# Patient Record
Sex: Female | Born: 1955 | Race: White | Hispanic: No | Marital: Married | Smoking: Former smoker
Health system: Southern US, Community
[De-identification: ages and names within clinical notes are randomized; demographics above are authoritative.]

## PROBLEM LIST (undated history)

## (undated) DIAGNOSIS — R51 Headache: Secondary | ICD-10-CM

## (undated) DIAGNOSIS — E079 Disorder of thyroid, unspecified: Secondary | ICD-10-CM

## (undated) DIAGNOSIS — C50919 Malignant neoplasm of unspecified site of unspecified female breast: Secondary | ICD-10-CM

## (undated) HISTORY — DX: Disorder of thyroid, unspecified: E07.9

## (undated) HISTORY — PX: TUBAL LIGATION: SHX77

## (undated) HISTORY — PX: FRACTURE SURGERY: SHX138

## (undated) HISTORY — PX: OTHER SURGICAL HISTORY: SHX169

## (undated) HISTORY — PX: LASIK: SHX215

## (undated) HISTORY — PX: WISDOM TOOTH EXTRACTION: SHX21

---

## 1999-04-23 ENCOUNTER — Other Ambulatory Visit: Admission: RE | Admit: 1999-04-23 | Discharge: 1999-04-23 | Payer: Self-pay | Admitting: Obstetrics and Gynecology

## 1999-05-07 ENCOUNTER — Encounter: Payer: Self-pay | Admitting: Obstetrics and Gynecology

## 1999-05-07 ENCOUNTER — Encounter: Admission: RE | Admit: 1999-05-07 | Discharge: 1999-05-07 | Payer: Self-pay | Admitting: Obstetrics and Gynecology

## 1999-05-12 ENCOUNTER — Encounter: Admission: RE | Admit: 1999-05-12 | Discharge: 1999-05-12 | Payer: Self-pay | Admitting: Obstetrics and Gynecology

## 1999-05-12 ENCOUNTER — Encounter: Payer: Self-pay | Admitting: Obstetrics and Gynecology

## 1999-12-17 ENCOUNTER — Encounter: Admission: RE | Admit: 1999-12-17 | Discharge: 1999-12-17 | Payer: Self-pay | Admitting: Obstetrics and Gynecology

## 1999-12-17 ENCOUNTER — Encounter: Payer: Self-pay | Admitting: Obstetrics and Gynecology

## 2000-09-25 ENCOUNTER — Encounter: Payer: Self-pay | Admitting: Obstetrics and Gynecology

## 2000-09-25 ENCOUNTER — Encounter: Admission: RE | Admit: 2000-09-25 | Discharge: 2000-09-25 | Payer: Self-pay | Admitting: Obstetrics and Gynecology

## 2000-09-26 ENCOUNTER — Other Ambulatory Visit: Admission: RE | Admit: 2000-09-26 | Discharge: 2000-09-26 | Payer: Self-pay | Admitting: Obstetrics and Gynecology

## 2001-11-08 ENCOUNTER — Encounter: Admission: RE | Admit: 2001-11-08 | Discharge: 2001-11-08 | Payer: Self-pay | Admitting: Obstetrics and Gynecology

## 2001-11-08 ENCOUNTER — Encounter: Payer: Self-pay | Admitting: Obstetrics and Gynecology

## 2001-12-24 ENCOUNTER — Other Ambulatory Visit: Admission: RE | Admit: 2001-12-24 | Discharge: 2001-12-24 | Payer: Self-pay | Admitting: Obstetrics and Gynecology

## 2002-11-25 ENCOUNTER — Encounter: Admission: RE | Admit: 2002-11-25 | Discharge: 2002-11-25 | Payer: Self-pay | Admitting: Obstetrics and Gynecology

## 2002-11-25 ENCOUNTER — Encounter: Payer: Self-pay | Admitting: Obstetrics and Gynecology

## 2003-03-10 ENCOUNTER — Other Ambulatory Visit: Admission: RE | Admit: 2003-03-10 | Discharge: 2003-03-10 | Payer: Self-pay | Admitting: Obstetrics and Gynecology

## 2003-03-11 ENCOUNTER — Encounter: Payer: Self-pay | Admitting: Obstetrics and Gynecology

## 2003-03-11 ENCOUNTER — Encounter: Admission: RE | Admit: 2003-03-11 | Discharge: 2003-03-11 | Payer: Self-pay | Admitting: Obstetrics and Gynecology

## 2003-04-23 ENCOUNTER — Emergency Department (HOSPITAL_COMMUNITY): Admission: AC | Admit: 2003-04-23 | Discharge: 2003-04-23 | Payer: Self-pay

## 2003-04-30 ENCOUNTER — Emergency Department (HOSPITAL_COMMUNITY): Admission: AD | Admit: 2003-04-30 | Discharge: 2003-04-30 | Payer: Self-pay | Admitting: Family Medicine

## 2003-06-30 ENCOUNTER — Encounter: Admission: RE | Admit: 2003-06-30 | Discharge: 2003-06-30 | Payer: Self-pay | Admitting: Obstetrics and Gynecology

## 2004-07-05 ENCOUNTER — Other Ambulatory Visit: Admission: RE | Admit: 2004-07-05 | Discharge: 2004-07-05 | Payer: Self-pay | Admitting: Obstetrics and Gynecology

## 2004-07-08 ENCOUNTER — Encounter: Admission: RE | Admit: 2004-07-08 | Discharge: 2004-07-08 | Payer: Self-pay | Admitting: Obstetrics and Gynecology

## 2005-06-21 ENCOUNTER — Encounter: Admission: RE | Admit: 2005-06-21 | Discharge: 2005-06-21 | Payer: Self-pay | Admitting: Obstetrics and Gynecology

## 2005-07-13 ENCOUNTER — Other Ambulatory Visit: Admission: RE | Admit: 2005-07-13 | Discharge: 2005-07-13 | Payer: Self-pay | Admitting: Obstetrics and Gynecology

## 2006-07-18 ENCOUNTER — Encounter: Admission: RE | Admit: 2006-07-18 | Discharge: 2006-07-18 | Payer: Self-pay | Admitting: Obstetrics and Gynecology

## 2007-11-20 ENCOUNTER — Encounter: Admission: RE | Admit: 2007-11-20 | Discharge: 2007-11-20 | Payer: Self-pay | Admitting: Obstetrics and Gynecology

## 2008-11-21 ENCOUNTER — Encounter: Admission: RE | Admit: 2008-11-21 | Discharge: 2008-11-21 | Payer: Self-pay | Admitting: Obstetrics and Gynecology

## 2010-01-25 ENCOUNTER — Encounter: Admission: RE | Admit: 2010-01-25 | Discharge: 2010-01-25 | Payer: Self-pay | Admitting: Obstetrics and Gynecology

## 2010-07-05 ENCOUNTER — Encounter: Payer: Self-pay | Admitting: Obstetrics and Gynecology

## 2010-12-27 ENCOUNTER — Other Ambulatory Visit: Payer: Self-pay | Admitting: Obstetrics and Gynecology

## 2010-12-27 DIAGNOSIS — Z1231 Encounter for screening mammogram for malignant neoplasm of breast: Secondary | ICD-10-CM

## 2011-01-31 ENCOUNTER — Ambulatory Visit: Payer: Self-pay

## 2011-02-04 ENCOUNTER — Ambulatory Visit
Admission: RE | Admit: 2011-02-04 | Discharge: 2011-02-04 | Disposition: A | Discharge status: Home / Self Care | Payer: BC Managed Care – PPO | Source: Ambulatory Visit | Attending: Obstetrics and Gynecology | Admitting: Obstetrics and Gynecology

## 2011-02-04 DIAGNOSIS — Z1231 Encounter for screening mammogram for malignant neoplasm of breast: Secondary | ICD-10-CM

## 2011-02-08 ENCOUNTER — Other Ambulatory Visit: Payer: Self-pay | Admitting: Obstetrics and Gynecology

## 2011-02-08 DIAGNOSIS — R928 Other abnormal and inconclusive findings on diagnostic imaging of breast: Secondary | ICD-10-CM

## 2011-02-25 ENCOUNTER — Ambulatory Visit
Admission: RE | Admit: 2011-02-25 | Discharge: 2011-02-25 | Disposition: A | Discharge status: Home / Self Care | Payer: BC Managed Care – PPO | Source: Ambulatory Visit | Attending: Obstetrics and Gynecology | Admitting: Obstetrics and Gynecology

## 2011-02-25 DIAGNOSIS — R928 Other abnormal and inconclusive findings on diagnostic imaging of breast: Secondary | ICD-10-CM

## 2011-03-30 ENCOUNTER — Inpatient Hospital Stay (INDEPENDENT_AMBULATORY_CARE_PROVIDER_SITE_OTHER)
Admission: RE | Admit: 2011-03-30 | Discharge: 2011-03-30 | Disposition: A | Discharge status: Home / Self Care | Payer: BC Managed Care – PPO | Source: Ambulatory Visit | Attending: Family Medicine | Admitting: Family Medicine

## 2011-03-30 DIAGNOSIS — R51 Headache: Secondary | ICD-10-CM

## 2011-03-30 LAB — POCT I-STAT, CHEM 8
HCT: 42 % (ref 36.0–46.0)
Hemoglobin: 14.3 g/dL (ref 12.0–15.0)
Potassium: 3.8 mEq/L (ref 3.5–5.1)
Sodium: 141 mEq/L (ref 135–145)

## 2011-03-30 LAB — CBC
HCT: 40 % (ref 36.0–46.0)
Hemoglobin: 13.5 g/dL (ref 12.0–15.0)
MCH: 31.2 pg (ref 26.0–34.0)
MCHC: 33.8 g/dL (ref 30.0–36.0)
MCV: 92.4 fL (ref 78.0–100.0)
Platelets: 286 10*3/uL (ref 150–400)
RBC: 4.33 MIL/uL (ref 3.87–5.11)
RDW: 13.1 % (ref 11.5–15.5)
WBC: 6.4 10*3/uL (ref 4.0–10.5)

## 2011-03-30 LAB — DIFFERENTIAL
Basophils Absolute: 0 10*3/uL (ref 0.0–0.1)
Basophils Relative: 0 % (ref 0–1)
Eosinophils Absolute: 0.1 10*3/uL (ref 0.0–0.7)
Eosinophils Relative: 2 % (ref 0–5)
Lymphocytes Relative: 56 % — ABNORMAL HIGH (ref 12–46)
Lymphs Abs: 3.6 10*3/uL (ref 0.7–4.0)
Monocytes Absolute: 0.5 10*3/uL (ref 0.1–1.0)
Monocytes Relative: 8 % (ref 3–12)
Neutro Abs: 2.2 10*3/uL (ref 1.7–7.7)
Neutrophils Relative %: 34 % — ABNORMAL LOW (ref 43–77)

## 2011-03-30 LAB — POCT URINALYSIS DIP (DEVICE)
Bilirubin Urine: NEGATIVE
Glucose, UA: NEGATIVE mg/dL
Hgb urine dipstick: NEGATIVE
Leukocytes, UA: NEGATIVE
Nitrite: NEGATIVE
Protein, ur: NEGATIVE mg/dL
Specific Gravity, Urine: >=1.03 (ref 1.005–1.030)
Urobilinogen, UA: 1 mg/dL (ref 0.0–1.0)
pH: 5 (ref 5.0–8.0)

## 2011-07-19 ENCOUNTER — Other Ambulatory Visit: Payer: Self-pay | Admitting: Obstetrics and Gynecology

## 2011-07-19 DIAGNOSIS — N63 Unspecified lump in unspecified breast: Secondary | ICD-10-CM

## 2011-08-18 ENCOUNTER — Ambulatory Visit
Admission: RE | Admit: 2011-08-18 | Discharge: 2011-08-18 | Disposition: A | Discharge status: Home / Self Care | Payer: BC Managed Care – PPO | Source: Ambulatory Visit | Attending: Obstetrics and Gynecology | Admitting: Obstetrics and Gynecology

## 2011-08-18 DIAGNOSIS — N63 Unspecified lump in unspecified breast: Secondary | ICD-10-CM

## 2012-08-22 ENCOUNTER — Other Ambulatory Visit: Payer: Self-pay | Admitting: Obstetrics and Gynecology

## 2012-09-03 ENCOUNTER — Ambulatory Visit
Admission: RE | Admit: 2012-09-03 | Discharge: 2012-09-03 | Disposition: A | Discharge status: Home / Self Care | Payer: BC Managed Care – PPO | Source: Ambulatory Visit | Attending: Obstetrics and Gynecology | Admitting: Obstetrics and Gynecology

## 2012-09-03 DIAGNOSIS — R921 Mammographic calcification found on diagnostic imaging of breast: Secondary | ICD-10-CM

## 2012-09-03 DIAGNOSIS — Z780 Asymptomatic menopausal state: Secondary | ICD-10-CM

## 2013-03-13 ENCOUNTER — Other Ambulatory Visit: Payer: Self-pay | Admitting: Obstetrics and Gynecology

## 2013-03-13 DIAGNOSIS — R921 Mammographic calcification found on diagnostic imaging of breast: Secondary | ICD-10-CM

## 2013-04-02 ENCOUNTER — Ambulatory Visit
Admission: RE | Admit: 2013-04-02 | Discharge: 2013-04-02 | Disposition: A | Discharge status: Home / Self Care | Payer: BC Managed Care – PPO | Source: Ambulatory Visit | Attending: Obstetrics and Gynecology | Admitting: Obstetrics and Gynecology

## 2013-04-02 ENCOUNTER — Other Ambulatory Visit: Payer: Self-pay | Admitting: Obstetrics and Gynecology

## 2013-04-02 DIAGNOSIS — R921 Mammographic calcification found on diagnostic imaging of breast: Secondary | ICD-10-CM

## 2013-04-05 ENCOUNTER — Ambulatory Visit
Admission: RE | Admit: 2013-04-05 | Discharge: 2013-04-05 | Disposition: A | Discharge status: Home / Self Care | Payer: BC Managed Care – PPO | Source: Ambulatory Visit | Attending: Obstetrics and Gynecology | Admitting: Obstetrics and Gynecology

## 2013-04-05 DIAGNOSIS — R921 Mammographic calcification found on diagnostic imaging of breast: Secondary | ICD-10-CM

## 2013-04-11 ENCOUNTER — Ambulatory Visit (INDEPENDENT_AMBULATORY_CARE_PROVIDER_SITE_OTHER): Payer: BC Managed Care – PPO | Admitting: General Surgery

## 2013-04-11 ENCOUNTER — Encounter (INDEPENDENT_AMBULATORY_CARE_PROVIDER_SITE_OTHER): Payer: Self-pay | Admitting: General Surgery

## 2013-04-11 ENCOUNTER — Encounter (INDEPENDENT_AMBULATORY_CARE_PROVIDER_SITE_OTHER): Payer: Self-pay

## 2013-04-11 VITALS — BP 128/66 | HR 92 | Resp 16 | Ht 63.5 in | Wt 162.6 lb

## 2013-04-11 DIAGNOSIS — D0512 Intraductal carcinoma in situ of left breast: Secondary | ICD-10-CM

## 2013-04-11 DIAGNOSIS — D0590 Unspecified type of carcinoma in situ of unspecified breast: Secondary | ICD-10-CM

## 2013-04-11 DIAGNOSIS — D0510 Intraductal carcinoma in situ of unspecified breast: Secondary | ICD-10-CM | POA: Insufficient documentation

## 2013-04-11 NOTE — Progress Notes (Signed)
Patient ID: Savannah Lang, female   DOB: 1955/12/28, 57 y.o.   MRN: [id number]  Chief Complaint  Patient presents with  . New Evaluation    eval ADH ca    HPI Savannah Lang is a 57 y.o. female.  We are asked to see the patient in consultation by Dr. 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 The patient is a 57 year old white female who recently went for a 6 month followup for some calcification seen in the left breast. At that time a new area of suspicious calcifications were seen in the upper outer left breast. 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 She denies any breast pain. She denies any discharge or nipple. She has no first degree relatives with breast cancer although she does have a couple of aunts on her mother side that had breast cancer. She has otherwise been in good health. HPI  Past Medical History  Diagnosis Date  . Thyroid disease     Past Surgical History  Procedure Laterality Date  . Tubal ligation    . Lasik    . Wisdom tooth extraction      Family History  Problem Relation Age of Onset  . Cancer Father     father  . Cancer Maternal Aunt     breast  . Cancer Maternal Grandfather     pancreas  . Cancer Paternal Grandfather     kidney    Social History History  Substance Use Topics  . Smoking status: Former Games developer  . Smokeless tobacco: Never Used  . Alcohol Use: No    Allergies  Allergen Reactions  . Codeine     Unknown to pt  . Sulfa Antibiotics     Unknown to pt    Current Outpatient Prescriptions  Medication Sig Dispense Refill  . Cholecalciferol (VITAMIN D-3 PO) Take by mouth.      . SYNTHROID 50 MCG tablet        No current facility-administered medications for this visit.    Review of Systems Review of Systems  Constitutional: Negative.   HENT: Negative.   Eyes: Negative.   Respiratory: Negative.   Cardiovascular: Negative.   Gastrointestinal: Negative.   Endocrine: Negative.   Genitourinary: Negative.    Musculoskeletal: Negative.   Skin: Negative.   Allergic/Immunologic: Negative.   Neurological: Negative.   Hematological: Negative.   Psychiatric/Behavioral: Negative.     Blood pressure 128/66, pulse 92, resp. rate 16, height 5' 3.5" (1.613 m), weight 162 lb 9.6 oz (73.755 kg).  Physical Exam Physical Exam  Constitutional: She is oriented to person, place, and time. She appears well-developed and well-nourished.  HENT:  Head: Normocephalic and atraumatic.  Eyes: Conjunctivae and EOM are normal. Pupils are equal, round, and reactive to light.  Neck: Normal range of motion. Neck supple.  Cardiovascular: Normal rate, regular rhythm and normal heart sounds.   Pulmonary/Chest: Effort normal and breath sounds normal.  There is no palpable mass in either breast. There is no palpable axillary, supraclavicular, or cervical lymphadenopathy  Abdominal: Bowel sounds are normal. She exhibits no mass. There is no tenderness.  Musculoskeletal: Normal range of motion.  Neurological: She is alert and oriented to person, place, and time.  Skin: Skin is warm and dry.  Psychiatric: She has a normal mood and affect. Her behavior is normal.    Data Reviewed As above  Assessment    The patient appears to have a small area of DCIS  in the upper outer left breast. I have talked to her in detail today about the different options for treatment of her cancer. This includes breast conservation versus mastectomy. And she feels strongly that she would like to preserve her breast and I think this is a reasonable option. She will require a wire localization. Because of the location of the DCIS in the probability that we would be unable to come back and mapper lymph nodes should there be an area of invasion I think she should have a sentinel node mapping done at the time of the surgery. I have discussed the surgery with her in detail including the risks and benefits as well as some of the technical aspects and she  understands and wishes to proceed. Once we have her final pathology back we will refer her to a medical and radiation oncology    Plan    Plan for left breast wire localized lumpectomy and sentinel node mapping        TOTH III,Nichollas Perusse S 04/11/2013, 4:27 PM

## 2013-04-11 NOTE — Patient Instructions (Signed)
Plan for left breast wire localized lumpectomy and sentinel node mapping 

## 2013-04-15 ENCOUNTER — Telehealth (INDEPENDENT_AMBULATORY_CARE_PROVIDER_SITE_OTHER): Payer: Self-pay

## 2013-04-15 NOTE — Telephone Encounter (Signed)
Patient is having sx 05-03-13 she has nasal congestion and feels this may prevent her having surgery. I advised her she would be elevated on  04-24-13 pre-op visit , they would determine if she is ok to go forward with surgery. She is also asking how many Lump nodes will be removed. I told her normally 2 to 3 but I would forward this to Dr. Carolynne Edouard for advice. Patient verbalized understanding

## 2013-04-16 NOTE — Telephone Encounter (Signed)
Called patient back and explained that we will not know how many lymph nodes will be taken out. Explained that a dye will be injected into her breast at time of surgery and how ever many nodes the dye goes to, that will be the number of nodes that he will take out. Explained the number is different with every patient. Patient states she understands this process now. She will call back with any more questions that arise.

## 2013-04-24 ENCOUNTER — Encounter (HOSPITAL_COMMUNITY): Payer: Self-pay

## 2013-04-24 ENCOUNTER — Encounter (HOSPITAL_COMMUNITY)
Admission: RE | Admit: 2013-04-24 | Discharge: 2013-04-24 | Disposition: A | Discharge status: Home / Self Care | Payer: BC Managed Care – PPO | Source: Ambulatory Visit | Attending: General Surgery | Admitting: General Surgery

## 2013-04-24 ENCOUNTER — Ambulatory Visit (HOSPITAL_COMMUNITY)
Admission: RE | Admit: 2013-04-24 | Discharge: 2013-04-24 | Disposition: A | Discharge status: Home / Self Care | Payer: BC Managed Care – PPO | Source: Ambulatory Visit | Attending: General Surgery | Admitting: General Surgery

## 2013-04-24 DIAGNOSIS — R0989 Other specified symptoms and signs involving the circulatory and respiratory systems: Secondary | ICD-10-CM | POA: Insufficient documentation

## 2013-04-24 DIAGNOSIS — R05 Cough: Secondary | ICD-10-CM | POA: Insufficient documentation

## 2013-04-24 DIAGNOSIS — Z01818 Encounter for other preprocedural examination: Secondary | ICD-10-CM | POA: Insufficient documentation

## 2013-04-24 DIAGNOSIS — R059 Cough, unspecified: Secondary | ICD-10-CM | POA: Insufficient documentation

## 2013-04-24 DIAGNOSIS — Z87891 Personal history of nicotine dependence: Secondary | ICD-10-CM | POA: Insufficient documentation

## 2013-04-24 HISTORY — DX: Headache: R51

## 2013-04-24 LAB — COMPREHENSIVE METABOLIC PANEL
AST: 24 U/L (ref 0–37)
Albumin: 3.9 g/dL (ref 3.5–5.2)
Alkaline Phosphatase: 83 U/L (ref 39–117)
BUN: 11 mg/dL (ref 6–23)
Chloride: 101 mEq/L (ref 96–112)
Glucose, Bld: 68 mg/dL — ABNORMAL LOW (ref 70–99)
Potassium: 3.8 mEq/L (ref 3.5–5.1)
Total Bilirubin: 0.2 mg/dL — ABNORMAL LOW (ref 0.3–1.2)

## 2013-04-24 LAB — CBC WITH DIFFERENTIAL/PLATELET
Hemoglobin: 12.8 g/dL (ref 12.0–15.0)
Lymphs Abs: 4.2 10*3/uL — ABNORMAL HIGH (ref 0.7–4.0)
Monocytes Relative: 8 % (ref 3–12)
Neutro Abs: 2.7 10*3/uL (ref 1.7–7.7)
Neutrophils Relative %: 36 % — ABNORMAL LOW (ref 43–77)
Platelets: 289 10*3/uL (ref 150–400)
RBC: 4.09 MIL/uL (ref 3.87–5.11)
WBC: 7.6 10*3/uL (ref 4.0–10.5)

## 2013-04-24 NOTE — Pre-Procedure Instructions (Signed)
Savannah Lang  04/24/2013   Your procedure is scheduled on: Friday November 21,2014 at 1130 AM  Report to [facility] Main Entrance "A" at 0930 AM.  Call this number if you have problems the morning of surgery: [phone]   Remember:   Do not eat food or drink liquids after midnight.   Take these medicines the morning of surgery with A SIP OF WATER: Synthroid   Stop Aspirin, Vitamins, Nsaids and Herbal Medications 5 days prior to surgery   Do not wear jewelry, make-up or nail polish.  Do not wear lotions, powders, or perfumes. You may wear deodorant.  Do not shave 48 hours prior to surgery.  Do not bring valuables to the hospital.  [facility] is not responsible for any belongings or valuables.               Contacts, dentures or bridgework may not be worn into surgery.  Leave suitcase in the car. After surgery it may be brought to your room.  For patients admitted to the hospital, discharge time is determined by your  treatment team.               Patients discharged the day of surgery will not be allowed to drive home.    Special Instructions: Shower using CHG 2 nights before surgery and the night before surgery.  If you shower the day of surgery use CHG.  Use special wash - you have one bottle of CHG for all showers.  You should use approximately 1/3 of the bottle for each shower.   Please read over the following fact sheets that you were given: Pain Booklet, Coughing and Deep Breathing, Surgical Site Infection Prevention and Anesthesia Post-op Instructions

## 2013-04-26 ENCOUNTER — Ambulatory Visit (INDEPENDENT_AMBULATORY_CARE_PROVIDER_SITE_OTHER): Payer: BC Managed Care – PPO | Admitting: General Surgery

## 2013-05-02 MED ORDER — CEFAZOLIN SODIUM-DEXTROSE 2-3 GM-% IV SOLR
2.0000 g | INTRAVENOUS | Status: AC
  Administered 2013-05-03: 2 g via INTRAVENOUS
  Filled 2013-05-02: qty 50

## 2013-05-03 ENCOUNTER — Encounter (HOSPITAL_COMMUNITY): Payer: Self-pay | Admitting: *Deleted

## 2013-05-03 ENCOUNTER — Encounter (HOSPITAL_COMMUNITY): Payer: BC Managed Care – PPO | Admitting: Certified Registered"

## 2013-05-03 ENCOUNTER — Encounter (HOSPITAL_COMMUNITY)
Admission: RE | Admit: 2013-05-03 | Discharge: 2013-05-03 | Disposition: A | Discharge status: Home / Self Care | Payer: BC Managed Care – PPO | Source: Ambulatory Visit | Attending: General Surgery | Admitting: General Surgery

## 2013-05-03 ENCOUNTER — Ambulatory Visit (HOSPITAL_COMMUNITY): Payer: BC Managed Care – PPO | Admitting: Certified Registered"

## 2013-05-03 ENCOUNTER — Ambulatory Visit
Admission: RE | Admit: 2013-05-03 | Discharge: 2013-05-03 | Disposition: A | Discharge status: Home / Self Care | Payer: BC Managed Care – PPO | Source: Ambulatory Visit | Attending: General Surgery | Admitting: General Surgery

## 2013-05-03 ENCOUNTER — Ambulatory Visit (HOSPITAL_COMMUNITY)
Admission: RE | Admit: 2013-05-03 | Discharge: 2013-05-03 | Disposition: A | Discharge status: Home / Self Care | Payer: BC Managed Care – PPO | Source: Ambulatory Visit | Attending: General Surgery | Admitting: General Surgery

## 2013-05-03 ENCOUNTER — Encounter (HOSPITAL_COMMUNITY)
Admission: RE | Disposition: A | Discharge status: Home / Self Care | Payer: Self-pay | Source: Ambulatory Visit | Attending: General Surgery

## 2013-05-03 DIAGNOSIS — D0590 Unspecified type of carcinoma in situ of unspecified breast: Secondary | ICD-10-CM | POA: Insufficient documentation

## 2013-05-03 DIAGNOSIS — N6019 Diffuse cystic mastopathy of unspecified breast: Secondary | ICD-10-CM

## 2013-05-03 DIAGNOSIS — D0512 Intraductal carcinoma in situ of left breast: Secondary | ICD-10-CM

## 2013-05-03 DIAGNOSIS — Z87891 Personal history of nicotine dependence: Secondary | ICD-10-CM | POA: Insufficient documentation

## 2013-05-03 DIAGNOSIS — R920 Mammographic microcalcification found on diagnostic imaging of breast: Secondary | ICD-10-CM

## 2013-05-03 DIAGNOSIS — D4860 Neoplasm of uncertain behavior of unspecified breast: Secondary | ICD-10-CM

## 2013-05-03 HISTORY — PX: BREAST LUMPECTOMY WITH NEEDLE LOCALIZATION AND AXILLARY SENTINEL LYMPH NODE BX: SHX5760

## 2013-05-03 SURGERY — BREAST LUMPECTOMY WITH NEEDLE LOCALIZATION AND AXILLARY SENTINEL LYMPH NODE BX
Anesthesia: General | Laterality: Left | Wound class: Clean

## 2013-05-03 MED ORDER — PROPOFOL 10 MG/ML IV BOLUS
INTRAVENOUS | Status: DC | PRN
  Administered 2013-05-03: 160 mg via INTRAVENOUS

## 2013-05-03 MED ORDER — LIDOCAINE HCL (CARDIAC) 20 MG/ML IV SOLN
INTRAVENOUS | Status: DC | PRN
  Administered 2013-05-03: 50 mg via INTRAVENOUS

## 2013-05-03 MED ORDER — SODIUM CHLORIDE 0.9 % IJ SOLN
INTRAMUSCULAR | Status: DC | PRN
  Administered 2013-05-03: 12:00:00 via SUBCUTANEOUS

## 2013-05-03 MED ORDER — MIDAZOLAM HCL 2 MG/2ML IJ SOLN
1.0000 mg | INTRAMUSCULAR | Status: DC | PRN
  Administered 2013-05-03: 1 mg via INTRAVENOUS

## 2013-05-03 MED ORDER — CHLORHEXIDINE GLUCONATE 4 % EX LIQD: 1.0000 "application " | Freq: Once | CUTANEOUS | Status: DC

## 2013-05-03 MED ORDER — LACTATED RINGERS IV SOLN
INTRAVENOUS | Status: DC
  Administered 2013-05-03: 11:00:00 via INTRAVENOUS

## 2013-05-03 MED ORDER — LACTATED RINGERS IV SOLN
INTRAVENOUS | Status: DC | PRN
  Administered 2013-05-03: 11:00:00 via INTRAVENOUS

## 2013-05-03 MED ORDER — TECHNETIUM TC 99M SULFUR COLLOID FILTERED
1.0000 | Freq: Once | INTRAVENOUS | Status: AC | PRN
  Administered 2013-05-03: 1 via INTRADERMAL

## 2013-05-03 MED ORDER — FENTANYL CITRATE 0.05 MG/ML IJ SOLN
50.0000 ug | Freq: Once | INTRAMUSCULAR | Status: AC
  Administered 2013-05-03: 50 ug via INTRAVENOUS

## 2013-05-03 MED ORDER — ONDANSETRON HCL 4 MG/2ML IJ SOLN
INTRAMUSCULAR | Status: DC | PRN
  Administered 2013-05-03: 4 mg via INTRAVENOUS

## 2013-05-03 MED ORDER — EPHEDRINE SULFATE 50 MG/ML IJ SOLN
INTRAMUSCULAR | Status: DC | PRN
  Administered 2013-05-03: 15 mg via INTRAVENOUS

## 2013-05-03 MED ORDER — FENTANYL CITRATE 0.05 MG/ML IJ SOLN
INTRAMUSCULAR | Status: DC | PRN
  Administered 2013-05-03: 100 ug via INTRAVENOUS
  Administered 2013-05-03 (×2): 50 ug via INTRAVENOUS

## 2013-05-03 MED ORDER — PHENYLEPHRINE HCL 10 MG/ML IJ SOLN
INTRAMUSCULAR | Status: DC | PRN
  Administered 2013-05-03: 120 ug via INTRAVENOUS
  Administered 2013-05-03 (×2): 80 ug via INTRAVENOUS
  Administered 2013-05-03: 120 ug via INTRAVENOUS

## 2013-05-03 MED ORDER — BUPIVACAINE-EPINEPHRINE 0.25% -1:200000 IJ SOLN
INTRAMUSCULAR | Status: DC | PRN
  Administered 2013-05-03: 25 mL

## 2013-05-03 MED ORDER — FENTANYL CITRATE 0.05 MG/ML IJ SOLN
25.0000 ug | INTRAMUSCULAR | Status: DC | PRN
  Administered 2013-05-03 (×3): 25 ug via INTRAVENOUS

## 2013-05-03 MED ORDER — OXYCODONE-ACETAMINOPHEN 5-325 MG PO TABS: 1.0000 | ORAL_TABLET | ORAL | Status: DC | PRN

## 2013-05-03 MED ORDER — FENTANYL CITRATE 0.05 MG/ML IJ SOLN
INTRAMUSCULAR | Status: AC
  Administered 2013-05-03: 25 ug via INTRAVENOUS
  Filled 2013-05-03: qty 2

## 2013-05-03 MED ORDER — MIDAZOLAM HCL 5 MG/5ML IJ SOLN
INTRAMUSCULAR | Status: DC | PRN
  Administered 2013-05-03: 2 mg via INTRAVENOUS

## 2013-05-03 MED ORDER — 0.9 % SODIUM CHLORIDE (POUR BTL) OPTIME
TOPICAL | Status: DC | PRN
  Administered 2013-05-03: 1000 mL

## 2013-05-03 SURGICAL SUPPLY — 52 items
ADH SKN CLS APL DERMABOND .7 (GAUZE/BANDAGES/DRESSINGS) ×1
APPLIER CLIP 9.375 MED OPEN (MISCELLANEOUS) ×2
APR CLP MED 9.3 20 MLT OPN (MISCELLANEOUS) ×1
BINDER BREAST LRG (GAUZE/BANDAGES/DRESSINGS) IMPLANT
BINDER BREAST XLRG (GAUZE/BANDAGES/DRESSINGS) IMPLANT
BLADE SURG 10 STRL SS (BLADE) ×2 IMPLANT
BLADE SURG 15 STRL LF DISP TIS (BLADE) ×1 IMPLANT
BLADE SURG 15 STRL SS (BLADE) ×2
CANISTER SUCTION 2500CC (MISCELLANEOUS) ×2 IMPLANT
CHLORAPREP W/TINT 26ML (MISCELLANEOUS) ×2 IMPLANT
CLIP APPLIE 9.375 MED OPEN (MISCELLANEOUS) IMPLANT
CONT SPEC 4OZ CLIKSEAL STRL BL (MISCELLANEOUS) ×2 IMPLANT
COVER PROBE W GEL 5X96 (DRAPES) ×2 IMPLANT
COVER SURGICAL LIGHT HANDLE (MISCELLANEOUS) ×2 IMPLANT
DERMABOND ADVANCED (GAUZE/BANDAGES/DRESSINGS) ×1
DERMABOND ADVANCED .7 DNX12 (GAUZE/BANDAGES/DRESSINGS) ×1 IMPLANT
DEVICE DUBIN SPECIMEN MAMMOGRA (MISCELLANEOUS) ×2 IMPLANT
DRAPE CHEST BREAST 15X10 FENES (DRAPES) ×2 IMPLANT
DRAPE UTILITY 15X26 W/TAPE STR (DRAPE) ×4 IMPLANT
ELECT COATED BLADE 2.86 ST (ELECTRODE) ×2 IMPLANT
ELECT REM PT RETURN 9FT ADLT (ELECTROSURGICAL) ×2
ELECTRODE REM PT RTRN 9FT ADLT (ELECTROSURGICAL) ×1 IMPLANT
GLOVE BIO SURGEON STRL SZ7.5 (GLOVE) ×3 IMPLANT
GLOVE BIOGEL PI IND STRL 7.0 (GLOVE) IMPLANT
GLOVE BIOGEL PI INDICATOR 7.0 (GLOVE) ×4
GLOVE SS BIOGEL STRL SZ 6.5 (GLOVE) IMPLANT
GLOVE SUPERSENSE BIOGEL SZ 6.5 (GLOVE) ×2
GLOVE SURG SS PI 7.0 STRL IVOR (GLOVE) ×1 IMPLANT
GOWN STRL NON-REIN LRG LVL3 (GOWN DISPOSABLE) ×6 IMPLANT
KIT BASIN OR (CUSTOM PROCEDURE TRAY) ×2 IMPLANT
KIT MARKER MARGIN INK (KITS) ×1 IMPLANT
KIT ROOM TURNOVER OR (KITS) ×2 IMPLANT
NDL 18GX1X1/2 (RX/OR ONLY) (NEEDLE) ×1 IMPLANT
NDL HYPO 25GX1X1/2 BEV (NEEDLE) ×2 IMPLANT
NEEDLE 18GX1X1/2 (RX/OR ONLY) (NEEDLE) ×2 IMPLANT
NEEDLE HYPO 25GX1X1/2 BEV (NEEDLE) ×4 IMPLANT
NS IRRIG 1000ML POUR BTL (IV SOLUTION) ×2 IMPLANT
PACK SURGICAL SETUP 50X90 (CUSTOM PROCEDURE TRAY) ×2 IMPLANT
PAD ARMBOARD 7.5X6 YLW CONV (MISCELLANEOUS) ×2 IMPLANT
PENCIL BUTTON HOLSTER BLD 10FT (ELECTRODE) ×2 IMPLANT
SPONGE LAP 18X18 X RAY DECT (DISPOSABLE) ×2 IMPLANT
SUT MNCRL AB 4-0 PS2 18 (SUTURE) ×3 IMPLANT
SUT SILK 2 0 SH (SUTURE) IMPLANT
SUT VIC AB 3-0 54X BRD REEL (SUTURE) ×1 IMPLANT
SUT VIC AB 3-0 BRD 54 (SUTURE) ×2
SUT VIC AB 3-0 SH 18 (SUTURE) ×2 IMPLANT
SYR BULB 3OZ (MISCELLANEOUS) ×2 IMPLANT
SYR CONTROL 10ML LL (SYRINGE) ×4 IMPLANT
TOWEL OR 17X24 6PK STRL BLUE (TOWEL DISPOSABLE) ×2 IMPLANT
TOWEL OR 17X26 10 PK STRL BLUE (TOWEL DISPOSABLE) ×2 IMPLANT
TUBE CONNECTING 12X1/4 (SUCTIONS) ×2 IMPLANT
YANKAUER SUCT BULB TIP NO VENT (SUCTIONS) ×2 IMPLANT

## 2013-05-03 NOTE — Preoperative (Signed)
Beta Blockers   Reason not to administer Beta Blockers:Not Applicable 

## 2013-05-03 NOTE — Transfer of Care (Signed)
Immediate Anesthesia Transfer of Care Note  Patient: Savannah Lang  Procedure(s) Performed: Procedure(s): BREAST LUMPECTOMY WITH NEEDLE LOCALIZATION AND AXILLARY SENTINEL LYMPH NODE BX (Left)  Patient Location: PACU  Anesthesia Type:General  Level of Consciousness: awake and oriented  Airway & Oxygen Therapy: Patient Spontanous Breathing and Patient connected to nasal cannula oxygen  Post-op Assessment: Report given to PACU RN  Post vital signs: Reviewed and stable  Complications: No apparent anesthesia complications

## 2013-05-03 NOTE — Anesthesia Postprocedure Evaluation (Signed)
  Anesthesia Post-op Note  Patient: Savannah Lang  Procedure(s) Performed: Procedure(s): BREAST LUMPECTOMY WITH NEEDLE LOCALIZATION AND AXILLARY SENTINEL LYMPH NODE BX (Left)  Patient Location: PACU  Anesthesia Type:General  Level of Consciousness: awake  Airway and Oxygen Therapy: Patient Spontanous Breathing  Post-op Pain: mild  Post-op Assessment: Post-op Vital signs reviewed  Post-op Vital Signs: Reviewed  Complications: No apparent anesthesia complications

## 2013-05-03 NOTE — Anesthesia Preprocedure Evaluation (Addendum)
Anesthesia Evaluation  Patient identified by MRN, date of birth, ID band Patient awake    Reviewed: Allergy & Precautions, H&P , NPO status , Patient's Chart, lab work & pertinent test results  Airway Mallampati: II TM Distance: >3 FB Neck ROM: Full    Dental  (+) Teeth Intact and Dental Advisory Given   Pulmonary former smoker,  breath sounds clear to auscultation        Cardiovascular negative cardio ROS  Rhythm:Regular Rate:Normal     Neuro/Psych    GI/Hepatic negative GI ROS, Neg liver ROS,   Endo/Other  negative endocrine ROS  Renal/GU negative Renal ROS     Musculoskeletal   Abdominal   Peds  Hematology   Anesthesia Other Findings   Reproductive/Obstetrics                          Anesthesia Physical Anesthesia Plan  ASA: III  Anesthesia Plan: General   Post-op Pain Management:    Induction: Intravenous  Airway Management Planned:   Additional Equipment:   Intra-op Plan:   Post-operative Plan: Extubation in OR  Informed Consent: I have reviewed the patients History and Physical, chart, labs and discussed the procedure including the risks, benefits and alternatives for the proposed anesthesia with the patient or authorized representative who has indicated his/her understanding and acceptance.   Dental advisory given  Plan Discussed with: CRNA, Anesthesiologist and Surgeon  Anesthesia Plan Comments:         Anesthesia Quick Evaluation

## 2013-05-03 NOTE — Interval H&P Note (Signed)
History and Physical Interval Note:  05/03/2013 10:47 AM  Savannah Lang  has presented today for surgery, with the diagnosis of left breast DCIS  The various methods of treatment have been discussed with the patient and family. After consideration of risks, benefits and other options for treatment, the patient has consented to  Procedure(s): BREAST LUMPECTOMY WITH NEEDLE LOCALIZATION AND AXILLARY SENTINEL LYMPH NODE BX (Left) as a surgical intervention .  The patient's history has been reviewed, patient examined, no change in status, stable for surgery.  I have reviewed the patient's chart and labs.  Questions were answered to the patient's satisfaction.     TOTH III,PAUL S

## 2013-05-03 NOTE — Anesthesia Procedure Notes (Signed)
Procedure Name: LMA Insertion Date/Time: 05/03/2013 11:32 AM Performed by: Jefm Miles E Pre-anesthesia Checklist: Patient identified, Emergency Drugs available, Suction available, Patient being monitored and Timeout performed Patient Re-evaluated:Patient Re-evaluated prior to inductionOxygen Delivery Method: Circle system utilized Preoxygenation: Pre-oxygenation with 100% oxygen Intubation Type: IV induction Ventilation: Mask ventilation without difficulty LMA: LMA inserted LMA Size: 4.0 Number of attempts: 1 Placement Confirmation: positive ETCO2 and breath sounds checked- equal and bilateral Tube secured with: Tape Dental Injury: Teeth and Oropharynx as per pre-operative assessment

## 2013-05-03 NOTE — Op Note (Signed)
05/03/2013  12:47 PM  PATIENT:  Savannah Lang  57 y.o. female  PRE-OPERATIVE DIAGNOSIS:  left breast DCIS  POST-OPERATIVE DIAGNOSIS:  left breast DCIS  PROCEDURE:  Procedure(s): BREAST LUMPECTOMY WITH NEEDLE LOCALIZATION AND AXILLARY SENTINEL LYMPH NODE BX (Left)  SURGEON:  Surgeon(s) and Role:    * Robyne Askew, MD - Primary  PHYSICIAN ASSISTANT:   ASSISTANTS: none   ANESTHESIA:   general  EBL:     BLOOD ADMINISTERED:none  DRAINS: none   LOCAL MEDICATIONS USED:  MARCAINE     SPECIMEN:  Source of Specimen:  left breast tissue and sentinel node X 2  DISPOSITION OF SPECIMEN:  PATHOLOGY  COUNTS:  YES  TOURNIQUET:  * No tourniquets in log *  DICTATION: .Dragon Dictation After informed consent was obtained the patient was brought to the operating room and placed in the supine position on the operating room table. After adequate induction of general anesthesia the patient's left chest, breast, and axilla were prepped with ChloraPrep, allowed to dry, and draped in usual sterile manner. Earlier in the day the patient underwent injection of 1 mCi of technetium sulfur colloid in the subareolar position on the left. Also earlier in the day the patient underwent wire localization procedure and the wire was entering the left breast in the upper outer quadrant and headed inferiorly. At this point, 2 cc of methylene blue and 3 cc of injectable saline were also injected in the subareolar position and the breast was massaged for several minutes. A transversely oriented curvilinear incision was made just beneath the wire entry site and was done in an elliptical fashion to include her biopsy site scar. This incision was carried through the skin and subcutaneous tissue sharply with electrocautery until the breast tissue was entered. Once into the breast tissue the path of the wire could be palpated. A circular portion of breast tissue was excised sharply around the path of the wire. Once this  was accomplished the specimen was removed And the margins were painted according to the assigned colors.  a specimen radiograph was obtained. The calcifications and clip were in the center of the specimen. The specimen was then sent to pathology for further evaluation. Hemostasis was achieved using the Bovie electrocautery. The wound was irrigated with copious amounts of saline. The deeper layer of the incision was then closed with interrupted 3-0 Vicryl stitches. The skin was then closed with interrupted 4-0 Monocryl subcuticular stitches. Attention was then turned to the left axilla. A small transversely oriented incision was made with the 15 blade knife overlying the hot spot that was identified with the neoprobe. The incision was carried through the skin and subcutaneous tissue sharply with electrocautery until the axilla was entered. A wheatland retractor was deployed. Using the neoprobe to direct blunt dissection 2 lymph nodes were identified. Both of these lymph nodes were hot but not blue. Both of these lymph nodes were excised by combination of sharp dissection with the electrocautery as well as clamping the lymphatics with hemostats, dividing them, and ligating them with 3-0 Vicryl ties. Ex vivo counts on These 2 Sentinel nodes Were 4000 and 2000 respectively. No other hot, blue, or palpable lymph nodes were identified in the left axilla. The deep layer of the axilla was closed with interrupted 3-0 Vicryl stitches. The skin was then closed with a running 4-0 Monocryl subcuticular stitch. The area was infiltrated with quarter Marcaine. Dermabond dressings were then applied. The patient tolerated the procedure well. At the  end of the case all needle sponge and instrument counts were correct. The patient was then awakened and taken to recovery in stable condition.  PLAN OF CARE: Discharge to home after PACU  PATIENT DISPOSITION:  PACU - hemodynamically stable.   Delay start of Pharmacological VTE agent  (>24hrs) due to surgical blood loss or risk of bleeding: not applicable

## 2013-05-03 NOTE — H&P (View-Only) (Signed)
Patient ID: Savannah Lang, female   DOB: 05/03/1956, 57 y.o.   MRN: [id number]  Chief Complaint  Patient presents with  . New Evaluation    eval ADH ca    HPI Savannah Lang is a 57 y.o. female.  We are asked to see the patient in consultation by Dr. 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 The patient is a 57-year-old white female who recently went for a 6 month followup for some calcification seen in the left breast. At that time a new area of suspicious calcifications were seen in the upper outer left breast. 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 She denies any breast pain. She denies any discharge or nipple. She has no first degree relatives with breast cancer although she does have a couple of aunts on her mother side that had breast cancer. She has otherwise been in good health. HPI  Past Medical History  Diagnosis Date  . Thyroid disease     Past Surgical History  Procedure Laterality Date  . Tubal ligation    . Lasik    . Wisdom tooth extraction      Family History  Problem Relation Age of Onset  . Cancer Father     father  . Cancer Maternal Aunt     breast  . Cancer Maternal Grandfather     pancreas  . Cancer Paternal Grandfather     kidney    Social History History  Substance Use Topics  . Smoking status: Former Smoker  . Smokeless tobacco: Never Used  . Alcohol Use: No    Allergies  Allergen Reactions  . Codeine     Unknown to pt  . Sulfa Antibiotics     Unknown to pt    Current Outpatient Prescriptions  Medication Sig Dispense Refill  . Cholecalciferol (VITAMIN D-3 PO) Take by mouth.      . SYNTHROID 50 MCG tablet        No current facility-administered medications for this visit.    Review of Systems Review of Systems  Constitutional: Negative.   HENT: Negative.   Eyes: Negative.   Respiratory: Negative.   Cardiovascular: Negative.   Gastrointestinal: Negative.   Endocrine: Negative.   Genitourinary: Negative.    Musculoskeletal: Negative.   Skin: Negative.   Allergic/Immunologic: Negative.   Neurological: Negative.   Hematological: Negative.   Psychiatric/Behavioral: Negative.     Blood pressure 128/66, pulse 92, resp. rate 16, height 5' 3.5" (1.613 m), weight 162 lb 9.6 oz (73.755 kg).  Physical Exam Physical Exam  Constitutional: She is oriented to person, place, and time. She appears well-developed and well-nourished.  HENT:  Head: Normocephalic and atraumatic.  Eyes: Conjunctivae and EOM are normal. Pupils are equal, round, and reactive to light.  Neck: Normal range of motion. Neck supple.  Cardiovascular: Normal rate, regular rhythm and normal heart sounds.   Pulmonary/Chest: Effort normal and breath sounds normal.  There is no palpable mass in either breast. There is no palpable axillary, supraclavicular, or cervical lymphadenopathy  Abdominal: Bowel sounds are normal. She exhibits no mass. There is no tenderness.  Musculoskeletal: Normal range of motion.  Neurological: She is alert and oriented to person, place, and time.  Skin: Skin is warm and dry.  Psychiatric: She has a normal mood and affect. Her behavior is normal.    Data Reviewed As above  Assessment    The patient appears to have a small area of DCIS   in the upper outer left breast. I have talked to her in detail today about the different options for treatment of her cancer. This includes breast conservation versus mastectomy. And she feels strongly that she would like to preserve her breast and I think this is a reasonable option. She will require a wire localization. Because of the location of the DCIS in the probability that we would be unable to come back and mapper lymph nodes should there be an area of invasion I think she should have a sentinel node mapping done at the time of the surgery. I have discussed the surgery with her in detail including the risks and benefits as well as some of the technical aspects and she  understands and wishes to proceed. Once we have her final pathology back we will refer her to a medical and radiation oncology    Plan    Plan for left breast wire localized lumpectomy and sentinel node mapping        TOTH III,PAUL S 04/11/2013, 4:27 PM    

## 2013-05-04 ENCOUNTER — Telehealth (INDEPENDENT_AMBULATORY_CARE_PROVIDER_SITE_OTHER): Payer: Self-pay | Admitting: General Surgery

## 2013-05-04 NOTE — Telephone Encounter (Signed)
Her husband called stating that she had developed hives from the oxycodone given to her for postoperative pain. I told him to have her stop the oxycodone. I told him that have her take Benadryl. I called in a tramadol prescription to their pharmacy. I also informed her husband that she is allergic to oxycodone and they should remember that.

## 2013-05-07 ENCOUNTER — Telehealth (INDEPENDENT_AMBULATORY_CARE_PROVIDER_SITE_OTHER): Payer: Self-pay | Admitting: *Deleted

## 2013-05-07 ENCOUNTER — Encounter (HOSPITAL_COMMUNITY): Payer: Self-pay | Admitting: General Surgery

## 2013-05-07 NOTE — Telephone Encounter (Signed)
Pt notified of po appt date.

## 2013-05-07 NOTE — Telephone Encounter (Signed)
LMOM for pt to return my call.  I was calling to inform her of her postop appt with Dr. Carolynne Edouard on 05/23/13 at 9:40am.

## 2013-05-08 NOTE — Addendum Note (Signed)
Addendum created 05/08/13 1017 by Judie Petit, MD   Modules edited: Anesthesia Attestations

## 2013-05-14 ENCOUNTER — Telehealth (INDEPENDENT_AMBULATORY_CARE_PROVIDER_SITE_OTHER): Payer: Self-pay | Admitting: General Surgery

## 2013-05-14 ENCOUNTER — Telehealth (INDEPENDENT_AMBULATORY_CARE_PROVIDER_SITE_OTHER): Payer: Self-pay

## 2013-05-14 NOTE — Telephone Encounter (Signed)
Pt called for message from [facility], regarding her pathology.  Message delivered and reminded of appt next week.

## 2013-05-14 NOTE — Telephone Encounter (Signed)
LMOM> path showed area of LCIS only; no invasive cancer and negative lymph nodes.

## 2013-05-14 NOTE — Telephone Encounter (Signed)
Message copied by [name] on Tue May 14, 2013  2:34 PM ------      Message from: Savannah Lang      Created: Tue May 14, 2013 11:57 AM      Contact: [phone]       [name],      Pt called she would like a call back with results from path. Report.       sonya ------

## 2013-05-23 ENCOUNTER — Encounter (INDEPENDENT_AMBULATORY_CARE_PROVIDER_SITE_OTHER): Payer: Self-pay | Admitting: General Surgery

## 2013-05-23 ENCOUNTER — Encounter (INDEPENDENT_AMBULATORY_CARE_PROVIDER_SITE_OTHER): Payer: Self-pay

## 2013-05-23 ENCOUNTER — Ambulatory Visit (INDEPENDENT_AMBULATORY_CARE_PROVIDER_SITE_OTHER): Payer: BC Managed Care – PPO | Admitting: General Surgery

## 2013-05-23 VITALS — BP 122/75 | HR 70 | Temp 98.1°F | Resp 18 | Ht 63.5 in | Wt 162.2 lb

## 2013-05-23 DIAGNOSIS — D0512 Intraductal carcinoma in situ of left breast: Secondary | ICD-10-CM

## 2013-05-23 DIAGNOSIS — D0590 Unspecified type of carcinoma in situ of unspecified breast: Secondary | ICD-10-CM

## 2013-05-23 DIAGNOSIS — C50919 Malignant neoplasm of unspecified site of unspecified female breast: Secondary | ICD-10-CM

## 2013-05-23 NOTE — Patient Instructions (Addendum)
May return to all normal activities Will refer to medical and radiation oncology

## 2013-05-24 ENCOUNTER — Encounter: Payer: Self-pay | Admitting: *Deleted

## 2013-05-24 ENCOUNTER — Telehealth: Payer: Self-pay | Admitting: *Deleted

## 2013-05-24 NOTE — Telephone Encounter (Signed)
Received appt date and time from Dr. Welton Flakes.  Called pt and confirmed 05/28/13 appt.  Mailed before appt letter, welcome packet & intake form to pt.  Emailed Music therapist at [facility] to make her aware.   Took paperwork to [facility] for chart.

## 2013-05-24 NOTE — Progress Notes (Signed)
Received referral from [facility].  Took paperwork to Dr. Welton Flakes for an appt.  Emailed Music therapist at [facility] to make her aware.

## 2013-05-27 ENCOUNTER — Encounter: Payer: Self-pay | Admitting: *Deleted

## 2013-05-27 ENCOUNTER — Other Ambulatory Visit: Payer: Self-pay | Admitting: *Deleted

## 2013-05-27 DIAGNOSIS — C50412 Malignant neoplasm of upper-outer quadrant of left female breast: Secondary | ICD-10-CM

## 2013-05-27 NOTE — Progress Notes (Signed)
Completed chart and gave to [facility] to enter labs and give back to me or place in Dr. Milta Deiters box.

## 2013-05-27 NOTE — Progress Notes (Signed)
Received chart back from Dawn and placed in Dr. Khan's box. 

## 2013-05-28 ENCOUNTER — Encounter (INDEPENDENT_AMBULATORY_CARE_PROVIDER_SITE_OTHER): Payer: Self-pay

## 2013-05-28 ENCOUNTER — Ambulatory Visit: Payer: BC Managed Care – PPO

## 2013-05-28 ENCOUNTER — Telehealth: Payer: Self-pay | Admitting: Oncology

## 2013-05-28 ENCOUNTER — Encounter: Payer: Self-pay | Admitting: Oncology

## 2013-05-28 ENCOUNTER — Other Ambulatory Visit (HOSPITAL_BASED_OUTPATIENT_CLINIC_OR_DEPARTMENT_OTHER): Payer: BC Managed Care – PPO

## 2013-05-28 ENCOUNTER — Ambulatory Visit (HOSPITAL_BASED_OUTPATIENT_CLINIC_OR_DEPARTMENT_OTHER): Payer: BC Managed Care – PPO | Admitting: Oncology

## 2013-05-28 VITALS — BP 114/76 | HR 90 | Temp 98.2°F | Resp 20 | Ht 63.5 in | Wt 161.8 lb

## 2013-05-28 DIAGNOSIS — C50412 Malignant neoplasm of upper-outer quadrant of left female breast: Secondary | ICD-10-CM

## 2013-05-28 DIAGNOSIS — C50419 Malignant neoplasm of upper-outer quadrant of unspecified female breast: Secondary | ICD-10-CM

## 2013-05-28 DIAGNOSIS — D0512 Intraductal carcinoma in situ of left breast: Secondary | ICD-10-CM

## 2013-05-28 DIAGNOSIS — D0590 Unspecified type of carcinoma in situ of unspecified breast: Secondary | ICD-10-CM

## 2013-05-28 DIAGNOSIS — Z170 Estrogen receptor positive status [ER+]: Secondary | ICD-10-CM

## 2013-05-28 LAB — CBC WITH DIFFERENTIAL/PLATELET
BASO%: 0.4 % (ref 0.0–2.0)
EOS%: 1.2 % (ref 0.0–7.0)
LYMPH%: 47.7 % (ref 14.0–49.7)
MCH: 30.8 pg (ref 25.1–34.0)
MCHC: 32.9 g/dL (ref 31.5–36.0)
MCV: 93.6 fL (ref 79.5–101.0)
MONO#: 0.5 10*3/uL (ref 0.1–0.9)
MONO%: 7.2 % (ref 0.0–14.0)
NEUT#: 2.7 10*3/uL (ref 1.5–6.5)
NEUT%: 43.5 % (ref 38.4–76.8)
Platelets: 282 10*3/uL (ref 145–400)
RBC: 4.29 10*6/uL (ref 3.70–5.45)
RDW: 13.7 % (ref 11.2–14.5)
lymph#: 3 10*3/uL (ref 0.9–3.3)

## 2013-05-28 LAB — COMPREHENSIVE METABOLIC PANEL (CC13)
ALT: 46 U/L (ref 0–55)
Alkaline Phosphatase: 80 U/L (ref 40–150)
Anion Gap: 10 mEq/L (ref 3–11)
CO2: 27 mEq/L (ref 22–29)
Calcium: 10 mg/dL (ref 8.4–10.4)
Chloride: 104 mEq/L (ref 98–109)
Creatinine: 0.8 mg/dL (ref 0.6–1.1)
Glucose: 149 mg/dl — ABNORMAL HIGH (ref 70–140)
Potassium: 3.5 mEq/L (ref 3.5–5.1)
Sodium: 141 mEq/L (ref 136–145)
Total Bilirubin: 0.4 mg/dL (ref 0.20–1.20)

## 2013-05-28 MED ORDER — TAMOXIFEN CITRATE 20 MG PO TABS: 20.0000 mg | ORAL_TABLET | Freq: Every day | ORAL | Status: AC

## 2013-05-28 NOTE — Patient Instructions (Signed)

## 2013-05-28 NOTE — Progress Notes (Signed)
Checked in new pt with no financial concerns. °

## 2013-05-28 NOTE — Progress Notes (Signed)
Savannah Lang [id number] 1956/04/06 57 y.o. 05/28/2013 1:12 PM  CC  Gaye Alken, MD [address] [place] [address] Dr. 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 Chevis Pretty  HISTORY OF PRESENT ILLNESS:  Savannah Lang is a 57 y.o. female.  Past medical history significant for a Whipple third visit. 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 She went on to have a lumpectomy performed on 05/03/2013. The final pathology revealed lobular neoplasia (lobular carcinoma in situ) with associated microcalcifications. 2 sentinel nodes were negative for metastatic disease. Postoperatively she has done well. She is now seen in medical oncology for discussion of adjuvant treatment options. She is without any complaints   Past Medical History: Past Medical History  Diagnosis Date  . Thyroid disease   . Headache(784.0)     migraines    Past Surgical History: Past Surgical History  Procedure Laterality Date  . Tubal ligation    . Lasik    . Wisdom tooth extraction    . Fracture surgery Right     wrist and ankle  . Breast lumpectomy with needle localization and axillary sentinel lymph node bx Left 05/03/2013    Procedure: BREAST LUMPECTOMY WITH NEEDLE LOCALIZATION AND AXILLARY SENTINEL LYMPH NODE BX;  Surgeon: Robyne Askew, MD;  Location: [facility];  Service: General;  Laterality: Left;    Family History: Family History  Problem Relation Age of Onset  . Cancer Father     father  . Cancer Maternal Aunt     breast  . Cancer Maternal Grandfather     pancreas  . Cancer Paternal Grandfather     kidney    Social History History  Substance Use Topics  . Smoking status: Former Smoker -- 1.00  packs/day for 31 years    Types: Cigarettes    Quit date: 06/24/1981  . Smokeless tobacco: Never Used  . Alcohol Use: No    Allergies: Allergies  Allergen Reactions  . Codeine     Unknown to pt  . Oxycodone Hives  . Sulfa Antibiotics     Unknown to pt  . Contrast Media [Iodinated Diagnostic Agents] Hives    Current Medications: Current Outpatient Prescriptions  Medication Sig Dispense Refill  . Cholecalciferol (VITAMIN D-3 PO) Take 2,000 Units by mouth daily.       Marland Kitchen levothyroxine (SYNTHROID, LEVOTHROID) 50 MCG tablet Take 50 mcg by mouth daily before breakfast.      . tamoxifen (NOLVADEX) 20 MG tablet Take 1 tablet (20 mg total) by mouth daily.  90 tablet  12   No current facility-administered medications for this visit.    OB/GYN History:menarche at age 41 postmenopausal and 2009 no hormone replacement therapy first live birth at 46  Fertility Discussion: not applicable Prior History of Cancer: no prior history  Health Maintenance:  Colonoscopy yes Bone Density yes Last PAP smear 2013  ECOG PERFORMANCE STATUS: 0 - Asymptomatic  Genetic Counseling/testing: there is noted to have several family members with malignancy one maternal aunt with breast cancer in her 69s and colon cancer 35 2 grandparents the pancreas and kidney. We discussed genetic counseling. I do not feel that she needs this at this time however if family history changes at some length  she certainly would be performed for genetic testing  REVIEW OF SYSTEMS:  A comprehensive review of systems was negative.  PHYSICAL EXAMINATION: Blood pressure 114/76, pulse 90, temperature 98.2 F (36.8 C), temperature source Oral, resp. rate 20, height 5' 3.5" (1.613 m), weight 161 lb 12.8 oz (73.392 kg).  WUJ:WJXBJ, healthy, no distress, well nourished and well developed SKIN: skin color, texture, turgor are normal HEAD: Normocephalic EYES: PERRLA, EOMI EARS: External ears normal OROPHARYNX:no exudate and lips,  buccal mucosa, and tongue normal  NECK: supple, no adenopathy LYMPH:  no palpable lymphadenopathy BREAST:right breast normal without mass, skin or nipple changes or axillary nodes, surgical scars noted in the left breast without any infections. LUNGS: clear to auscultation  HEART: regular rate & rhythm ABDOMEN:abdomen soft, non-tender, normal bowel sounds and no masses or organomegaly BACK: Back symmetric, no curvature. EXTREMITIES:less then 2 second capillary refill, no edema, no clubbing, no cyanosis  NEURO: alert & oriented x 3 with fluent speech, no focal motor/sensory deficits, gait normal     STUDIES/RESULTS: Nm Sentinel Node Inj-no Rpt (breast)  05/03/2013   CLINICAL DATA: left breast dcis   Sulfur colloid was injected intradermally by the nuclear medicine  technologist for breast cancer sentinel node localization.    Mm Lt Plc Breast Loc Dev   1st Lesion  Inc Mammo Guide  05/03/2013   CLINICAL DATA:  The patient presents for pre-surgical localization of the prior stereotactic biopsy site in the left upper outer quadrant of the breast.  EXAM: NEEDLE LOCALIZATION OF THE left BREAST WITH MAMMO GUIDANCE  COMPARISON:  Previous exams.  FINDINGS: Patient presents for needle localization prior to left breast lumpectomy for ductal carcinoma in situ. I met with the patient and we discussed the procedure of needle localization including benefits and alternatives. We discussed the high likelihood of a successful procedure. We discussed the risks of the procedure, including infection, bleeding, tissue injury, and further surgery. Informed, written consent was given. The usual time-out protocol was performed immediately prior to the procedure.  Using mammographic guidance, sterile technique, 2% lidocaine and a 7 cm modified Kopans needle, the stereotactic biopsy clip in the left upper outer quadrant of the breast was localized using a craniocaudad approach. The films were marked for Dr. Carolynne Edouard.   Specimen radiograph was performed at [facility] and confirms the barrel clip is present in the tissue sample. The specimen was marked for pathology.  IMPRESSION: Needle localization of the left breast. No apparent complications.   Electronically Signed   By: Leda Gauze M.D.   On: 05/03/2013 12:28     LABS:    Chemistry      Component Value Date/Time   NA 141 05/28/2013 1209   NA 140 04/24/2013 1450   K 3.5 05/28/2013 1209   K 3.8 04/24/2013 1450   CL 101 04/24/2013 1450   CO2 27 05/28/2013 1209   CO2 28 04/24/2013 1450   BUN 7.9 05/28/2013 1209   BUN 11 04/24/2013 1450   CREATININE 0.8 05/28/2013 1209   CREATININE 0.80 04/24/2013 1450      Component Value Date/Time   CALCIUM 10.0 05/28/2013 1209   CALCIUM 9.8 04/24/2013 1450   ALKPHOS 80 05/28/2013 1209   ALKPHOS 83 04/24/2013 1450   AST 30 05/28/2013 1209   AST 24 04/24/2013 1450   ALT 46 05/28/2013 1209   ALT 28 04/24/2013 1450   BILITOT 0.40 05/28/2013 1209   BILITOT 0.2* 04/24/2013 1450      Lab  Results  Component Value Date   WBC 6.3 05/28/2013   HGB 13.2 05/28/2013   HCT 40.2 05/28/2013   MCV 93.6 05/28/2013   PLT 282 05/28/2013       PATHOLOGY: Diagnosis 1. Breast, lumpectomy, Left - LOBULAR NEOPLASIA (LOBULAR CARCINOMA IN SITU) WITH ASSOCIATED MICROCALCIFICATIONS. - FIBROCYSTIC CHANGES WITH ASSOCIATED MICROCALCIFICATIONS. - BENIGN SKIN WITH SCAR TISSUE FORMATION. - SEE COMMENT. 2. Lymph node, sentinel, biopsy, Left axillary #1 - ONE BENIGN LYMPH NODE WITH NO TUMOR SEEN (0/1), SEE COMMENT. 3. Lymph node, sentinel, biopsy, Left axillary #2 - ONE BENIGN LYMPH NODE WITH NO TUMOR SEEN (0/1), SEE COMMENT. Microscopic Comment 1. Immunohistochemical stains for E-cadherin are performed on two blocks in the left lumpectomy specimen. These stains fail to highlight areas of epithelial proliferation morphologically consistent with lobular carcinoma in situ confirming the above diagnosis.  (RAH:caf 05/07/13) 2. and 3: A cytokeratin AE1/AE3 stain is performed on all sections of the lymph nodes (3 stains total) to exclude the possibility of metastatic lobular carcinoma as lobular carcinoma in situ is present within the left lumpectomy specimen. The stains are both negative confirming the lack of metastatic carcinoma. Zandra Abts MD Pathologist, Electronic Signature (Case signed 05/08/2013) Specimen Gross and Clinical Information Specimen(s) Obtained: 1. Breast, lumpectomy, Left 2. Lymph node, sentinel, biopsy, Left axillary #1 3. Lymph node, sentinel, biopsy, Left axillary #2 Specimen Clinical Information 1. Left breast DCIS (tl) 1 of 2 FINAL for [place], Arin T ([id number]) Gross 1. Specimen type: Left breast needle localization lumpectomy, received fresh. The specimen was placed in formalin at 12:55 pm on 05/03/2013. Size: 6.8 cm along the anterior-posterior axis x 5.5 cm along the medial-lateral axis x 3 cm along the superior-inferior axis. There is a 3 x 0.7 cm portion of skin attached along the anterior surface. Orientation: The specimen is oriented with previously applied inks (anterior green, inferior blue, lateral orange, medial yellow, posterior black, superior red). Localized area: The localized area is marked with an inserted wire and pin. Cut surface: The cut surface at the localized area shows a stellate area of fibrosis and fat necrosis consistent with the previous biopsy site. There is a silver metallic biopsy clip present. This area measures 1.7 x 1 x 0.8 cm. The surrounding tissue consists of fat and soft white fibrous tissue. Margins: Previous biopsy site extends closest to the junction of the lateral and inferior margins, which measures 0.5 cm. The remaining margins measure greater than 1 cm. Prognostic indicators: Obtain from paraffin blocks if needed. Block summary: Ten blocks submitted: A - H = entire previous biopsy site to include the lateral and  inferior margins. I = superior and medial margins. J = anterior and posterior margins. 2. Rapid Intraoperative Consult performed (Yes or No): No Specimen: Left axillary sentinel lymph node, received fresh. Number and size: One, 1.6 cm. Cut Surface(s): Tan-pink with fatty infiltration. Block Summary: The lymph node is entirely submitted in one cassette. 3. Rapid Intraoperative Consult performed (Yes or No): No Specimen: Left axillary sentinel lymph node, received fresh. Number and size: One, 2.2 cm. Cut Surface(s): Tan-pink to blue tinge with fatty infiltration. Block Summary: The lymph node is entirely submitted in two cassettes. (GRP:ecj 05/03/2013) Stain(s) used in Diagnosis: The following stain(s) were used in diagnosing  ASSESSMENT/PLAN:    57 year old female with DCIS/LCIS. DCIS was ER positive. Definitive diagnosis is LCIS. Postoperatively patient is doing well and is without any complaints. Patient and I discussed adjuvant treatment options. This would include antiestrogen therapy consisting of  tamoxifen 20 mg daily to prevent future breast cancer risk. We discussed the side effects of tamoxifen including but not limited to blood clots orally cataracts hot flashes night sweats mood swings weight gain possibly uterine cancer. But since patient is postmenopausal she would also be a candidate or Aromasin.  Since patient did have a DCIS I would recommend patient be seen by Dr. Lurline Hare in radiation oncology for discussion regarding the role of radiation in patients present situation. If Dr. Michell Heinrich feels that she does not need radiation then certainly patient could proceed with tamoxifen alone. We discussed this considerably.I have given a prescription to the patient for tamoxifen. The patient does not get radiation then she certainly can begin tamoxifen as soon as possible.  I will plan on seeing her back in about 4-5 months time for followup.  Discussion: Patient is being  treated per NCCN breast cancer care guidelines appropriate for stage.0   Thank you so much for allowing me to participate in the care of Savannah Lang. I will continue to follow up the patient with you and assist in her care.  All questions were answered. The patient knows to call the clinic with any problems, questions or concerns. We can certainly see the patient much sooner if necessary.  I spent 40 minutes counseling the patient face to face. The total time spent in the appointment was 45 minutes.   Drue Second, MD Medical/Oncology [facility] [phone] (beeper) [phone] (Office)  05/28/2013, 1:12 PM

## 2013-05-28 NOTE — Telephone Encounter (Signed)
gv and printed appt sched and avs forpt for March 2015  °

## 2013-05-29 NOTE — Progress Notes (Signed)
Subjective:     Patient ID: Savannah Lang, female   DOB: 1956/04/19, 57 y.o.   MRN: [id number]  HPI The patient is a 57 year old female who is 3 weeks status post left breast lumpectomy. Her original core biopsy showed DCIS. Her final pathology showed only LCIS. She tolerated the surgery well. She denies any breast pain. She did have a reaction after the surgery that she attributes to the methylene blue which consisted of skin itching and rash  Review of Systems     Objective:   Physical Exam On exam her left breast incision is healing nicely with no sign of infection or significant seroma    Assessment:     The patient is 3 weeks status post left breast lumpectomy for DCIS and LCIS     Plan:     At this point we will refer her to the medical and radiation oncologist. I will plan to see her back in the months to check her progress.

## 2013-05-31 ENCOUNTER — Encounter (INDEPENDENT_AMBULATORY_CARE_PROVIDER_SITE_OTHER): Payer: Self-pay

## 2013-05-31 ENCOUNTER — Encounter: Payer: Self-pay | Admitting: *Deleted

## 2013-05-31 ENCOUNTER — Encounter (INDEPENDENT_AMBULATORY_CARE_PROVIDER_SITE_OTHER): Payer: Self-pay | Admitting: General Surgery

## 2013-05-31 ENCOUNTER — Ambulatory Visit (INDEPENDENT_AMBULATORY_CARE_PROVIDER_SITE_OTHER): Payer: BC Managed Care – PPO | Admitting: General Surgery

## 2013-05-31 VITALS — BP 120/82 | HR 68 | Temp 99.0°F | Resp 14 | Ht 63.5 in | Wt 164.4 lb

## 2013-05-31 DIAGNOSIS — T8149XA Infection following a procedure, other surgical site, initial encounter: Secondary | ICD-10-CM | POA: Insufficient documentation

## 2013-05-31 DIAGNOSIS — T8140XA Infection following a procedure, unspecified, initial encounter: Secondary | ICD-10-CM

## 2013-05-31 MED ORDER — DOXYCYCLINE HYCLATE 100 MG PO CAPS: 100.0000 mg | ORAL_CAPSULE | Freq: Two times a day (BID) | ORAL | Status: DC

## 2013-05-31 NOTE — Patient Instructions (Signed)
Cellulitis Cellulitis is an infection of the skin and the tissue beneath it. The infected area is usually red and tender. Cellulitis occurs most often in the arms and lower legs.  CAUSES  Cellulitis is caused by bacteria that enter the skin through cracks or cuts in the skin. The most common types of bacteria that cause cellulitis are Staphylococcus and Streptococcus. SYMPTOMS   Redness and warmth.  Swelling.  Tenderness or pain.  Fever. DIAGNOSIS  Your caregiver can usually determine what is wrong based on a physical exam. Blood tests may also be done. TREATMENT  Treatment usually involves taking an antibiotic medicine. HOME CARE INSTRUCTIONS   Take your antibiotics as directed. Finish them even if you start to feel better.    Apply a warm cloth to the affected area up to 4 times per day to relieve pain.  Only take over-the-counter or prescription medicines for pain, discomfort, or fever as directed by your caregiver.  Keep all follow-up appointments as directed by your caregiver. SEEK MEDICAL CARE IF:   You notice red streaks coming from the infected area.  Your red area gets larger or turns dark in color.  Your bone or joint underneath the infected area becomes painful after the skin has healed.  Your infection returns in the same area or another area.  You notice a swollen bump in the infected area.  You develop new symptoms. SEEK IMMEDIATE MEDICAL CARE IF:   You have a fever.  You feel very sleepy.  You develop vomiting or diarrhea.  You have a general ill feeling (malaise) with muscle aches and pains. MAKE SURE YOU:   Understand these instructions.  Will watch your condition.  Will get help right away if you are not doing well or get worse. Document Released: 03/09/2005 Document Revised: 11/29/2011 Document Reviewed: 08/15/2011 [facility] Patient Information 2014 [place], [place].

## 2013-05-31 NOTE — Progress Notes (Signed)
Mailed after appt letter to pt. 

## 2013-06-01 NOTE — Progress Notes (Signed)
Subjective:     Patient ID: Savannah Lang, female   DOB: [birth year], 57 y.o.   MRN: [id number]  HPI 57 year old Caucasian female status post left breast lumpectomy on November 21 Dr. Carolynne Edouard comes in because of tenderness as well as noticing a knot near midportion of her breast incision. Last night she started developing shooting pains from her breast incision to her back. She also noticed that the skin was getting a little bit pain. She denies any fever, chills, chest pain, shortness of breath or leg swelling.  Review of Systems     Objective:   Physical Exam  Vitals reviewed. Constitutional: She appears well-developed and well-nourished. No distress.  Pulmonary/Chest:    Upper outer quadrant well-healed breast incision. Along the medial aspect of the incision extending for about 2 cm inferior to the incision there is an area of induration. There is also some mild subtle cellulitis. The total area measures 2 x 2 centimeters.  Skin: She is not diaphoretic.   BP 120/82  Pulse 68  Temp(Src) 99 F (37.2 C) (Temporal)  Resp 14  Ht 5' 3.5" (1.613 m)  Wt 164 lb 6.4 oz (74.571 kg)  BMI 28.66 kg/m2     Assessment:     Status post left breast lumpectomy with some cellulitis and probable hematoma     Plan:     This is not consistent with a seroma. It is rather firm. Probably hematoma. There is some mild cellulitis. I don't think is at the point where it needs aspiration or drainage. I'm not optimistic that anything would come out with aspiration due to how dense it feels. I will place her on doxycycline for a week. She is scheduled to see Dr. Carolynne Edouard next week for a wound check. She was advised on what to call for such as fever, worsening pain, worsening cellulitis  Mary Sella. Andrey Campanile, MD, FACS General, Bariatric, & Minimally Invasive Surgery [facility], [place]

## 2013-06-02 ENCOUNTER — Encounter (INDEPENDENT_AMBULATORY_CARE_PROVIDER_SITE_OTHER): Payer: Self-pay | Admitting: General Surgery

## 2013-06-07 ENCOUNTER — Encounter (INDEPENDENT_AMBULATORY_CARE_PROVIDER_SITE_OTHER): Payer: Self-pay | Admitting: General Surgery

## 2013-06-07 ENCOUNTER — Ambulatory Visit (INDEPENDENT_AMBULATORY_CARE_PROVIDER_SITE_OTHER): Payer: BC Managed Care – PPO | Admitting: General Surgery

## 2013-06-07 VITALS — BP 120/70 | HR 80 | Temp 98.8°F | Resp 14 | Ht 63.5 in | Wt 159.8 lb

## 2013-06-07 DIAGNOSIS — D0512 Intraductal carcinoma in situ of left breast: Secondary | ICD-10-CM

## 2013-06-07 DIAGNOSIS — D0590 Unspecified type of carcinoma in situ of unspecified breast: Secondary | ICD-10-CM

## 2013-06-07 NOTE — Patient Instructions (Signed)
Continue regular self exams Start tamoxifen if no radiation

## 2013-06-21 ENCOUNTER — Encounter: Payer: Self-pay | Admitting: Radiation Oncology

## 2013-06-21 ENCOUNTER — Ambulatory Visit
Admission: RE | Admit: 2013-06-21 | Discharge: 2013-06-21 | Disposition: A | Discharge status: Home / Self Care | Payer: BC Managed Care – PPO | Source: Ambulatory Visit | Attending: Radiation Oncology | Admitting: Radiation Oncology

## 2013-06-21 VITALS — Resp 16 | Ht 64.0 in | Wt 163.6 lb

## 2013-06-21 DIAGNOSIS — Z87891 Personal history of nicotine dependence: Secondary | ICD-10-CM | POA: Insufficient documentation

## 2013-06-21 DIAGNOSIS — C50412 Malignant neoplasm of upper-outer quadrant of left female breast: Secondary | ICD-10-CM

## 2013-06-21 DIAGNOSIS — Z800 Family history of malignant neoplasm of digestive organs: Secondary | ICD-10-CM | POA: Insufficient documentation

## 2013-06-21 DIAGNOSIS — N6489 Other specified disorders of breast: Secondary | ICD-10-CM | POA: Insufficient documentation

## 2013-06-21 DIAGNOSIS — Z79899 Other long term (current) drug therapy: Secondary | ICD-10-CM | POA: Insufficient documentation

## 2013-06-21 DIAGNOSIS — N6019 Diffuse cystic mastopathy of unspecified breast: Secondary | ICD-10-CM | POA: Insufficient documentation

## 2013-06-21 DIAGNOSIS — R920 Mammographic microcalcification found on diagnostic imaging of breast: Secondary | ICD-10-CM | POA: Insufficient documentation

## 2013-06-21 DIAGNOSIS — N63 Unspecified lump in unspecified breast: Secondary | ICD-10-CM | POA: Insufficient documentation

## 2013-06-21 DIAGNOSIS — E079 Disorder of thyroid, unspecified: Secondary | ICD-10-CM | POA: Insufficient documentation

## 2013-06-21 DIAGNOSIS — D0590 Unspecified type of carcinoma in situ of unspecified breast: Secondary | ICD-10-CM | POA: Insufficient documentation

## 2013-06-21 HISTORY — DX: Malignant neoplasm of unspecified site of unspecified female breast: C50.919

## 2013-06-21 NOTE — Progress Notes (Signed)
Radiation Oncology         [phone]) [phone] ________________________________  Initial outpatient Consultation - Date: 06/21/2013   Name: Savannah Lang MRN: [id number]   DOB: [birth year]  REFERRING PHYSICIAN: Merrie Roof, MD  DIAGNOSIS:  1. Breast cancer of upper-outer quadrant of left female breast    HISTORY OF PRESENT ILLNESS::Savannah Lang is a 58 y.o. female  who presented with a left breast calcifications. She had a stereotactic biopsy of the left breast on 04/05/2013 which showed mammary carcinoma in situ which was ER/PR positive at 100%. There was incomplete representation of the carcinoma on the slide and focal he can't hear and positivity suggesting this was possibly DCIS. Her that reason she was referred to surgery and had a lumpectomy by Dr. Marlou Lang on 05/03/2013 that showed LCIS with associated microcalcifications as well as fibrocystic change with microcalcifications and no evidence of DCIS. Zero out of two lymph nodes were seen as well. Her margins were at least 1 cm in all directions with the closest margin at the lateral inferior which was 0.5 cm. She was seen by Dr. Humphrey Lang who recommended antiestrogen therapy and referred to me for the possibility of radiation. She has a distant family history of breast cancer in great aunts. Her father had colon cancer. She had menarche at age 76. She underwent menopause in 2009 and had her first live birth at 84. She is not on hormone replacement therapy. She'll relatively well from her surgery. She was concerned about a nodule underneath her lumpectomy incision and was seen at the surgeon's office. Concern for hematoma versus cellulitis was raised. She was placed on doxycycline. She said she didn't really notice a change in the size of this area but did have decreased pain after finishing her weeklong course of antibiotics. The nodule continues to be there but there is no associated fever or chills.  PREVIOUS RADIATION THERAPY: No  PAST MEDICAL  HISTORY:  has a past medical history of Thyroid disease; Headache(784.0); and Breast cancer.    PAST SURGICAL HISTORY: Past Surgical History  Procedure Laterality Date  . Tubal ligation    . Lasik    . Wisdom tooth extraction    . Fracture surgery Right     wrist and ankle  . Breast lumpectomy with needle localization and axillary sentinel lymph node bx Left 05/03/2013    Procedure: BREAST LUMPECTOMY WITH NEEDLE LOCALIZATION AND AXILLARY SENTINEL LYMPH NODE BX;  Surgeon: Savannah Roof, MD;  Location: [facility];  Service: General;  Laterality: Left;    FAMILY HISTORY:  Family History  Problem Relation Age of Onset  . Cancer Father     father  . Cancer Maternal Aunt     breast  . Cancer Maternal Grandfather     pancreas  . Cancer Paternal Grandfather     kidney    SOCIAL HISTORY:  History  Substance Use Topics  . Smoking status: Former Smoker -- 1.00 packs/day for 31 years    Types: Cigarettes    Quit date: 06/24/1981  . Smokeless tobacco: Never Used  . Alcohol Use: No    ALLERGIES: Codeine; Oxycodone; Sulfa antibiotics; and Contrast media  MEDICATIONS:  Current Outpatient Prescriptions  Medication Sig Dispense Refill  . Cholecalciferol (VITAMIN D-3 PO) Take 2,000 Units by mouth daily.       Marland Kitchen levothyroxine (SYNTHROID, LEVOTHROID) 50 MCG tablet Take 50 mcg by mouth daily before breakfast.      . doxycycline (VIBRAMYCIN) 100 MG  capsule Take 1 capsule (100 mg total) by mouth 2 (two) times daily.  14 capsule  0  . tamoxifen (NOLVADEX) 20 MG tablet Take 1 tablet (20 mg total) by mouth daily.  90 tablet  12   No current facility-administered medications for this encounter.    REVIEW OF SYSTEMS:  A 15 point review of systems is documented in the electronic medical record. This was obtained by the nursing staff. However, I reviewed this with the patient to discuss relevant findings and make appropriate changes.  Pertinent items are noted in HPI.  PHYSICAL EXAM:  Filed  Vitals:   06/21/13 0841  Resp: 16  .163 lb 9.6 oz (74.208 kg). She is a pleasant female in no distress sitting comfortably on examining table. She has pendulous breasts bilaterally. There is no palpable abnormalities of the right breast. On the left breast just in the medial aspect of her scar in the upper outer quadrant there is a 1 cm palpable nodule. This is consistent with a small seroma. Her sentinel lymph node scar is healing well. There is no other palpable abnormalities of her left breast. She is alert and oriented x3. She has normal gait as well as normal mood affect and judgment.  LABORATORY DATA:  Lab Results  Component Value Date   WBC 6.3 05/28/2013   HGB 13.2 05/28/2013   HCT 40.2 05/28/2013   MCV 93.6 05/28/2013   PLT 282 05/28/2013   Lab Results  Component Value Date   NA 141 05/28/2013   K 3.5 05/28/2013   CL 101 04/24/2013   CO2 27 05/28/2013   Lab Results  Component Value Date   ALT 46 05/28/2013   AST 30 05/28/2013   ALKPHOS 80 05/28/2013   BILITOT 0.40 05/28/2013     RADIOGRAPHY: No results found.    IMPRESSION: LCIS of the left breast status post lumpectomy  PLAN: I spoke to Savannah Lang today. We went over the difference between LCIS and DCIS. We discussed the fact that LCIS is a risk factor for the development of breast cancer and does not require adjuvant radiation treatment after excision. We discussed the treatment of LCIS is antiestrogen therapy which helps to prevent the development of cancer and of both breasts. I had our pathologist review her surgical specimen and compared to her biopsy and they believe that and the biopsy is consistent with LCIS given the findings on her lumpectomy.. She also has widely negative margins on her lumpectomy thereby further decreasing the need for radiation even if this was DCIS. I feel very comfortable with her not receiving radiation being treated with antiestrogen therapy alone. She has followup scheduled with Dr. Marlou Lang as  well as a prescription for tamoxifen. I will speak to both of them and will let Dr. Humphrey Lang contact her if she should begin her antiestrogen therapy immediately. She had many questions we went over these. I will be happy to see her back on an as-needed basis.  I spent 40 minutes  face to face with the patient and more than 50% of that time was spent in counseling and/or coordination of care.   ------------------------------------------------  Thea Silversmith, MD

## 2013-06-21 NOTE — Progress Notes (Signed)
See progress note under physician encounter. 

## 2013-06-21 NOTE — Progress Notes (Signed)
Patient reports insomnia. Patient denies nipple discharge and fever in the breast. Patient reports lymph node incision closed without redness, drainage or edema. Patient states,"I carry a pillow under my left arm while at work because it feels like I have rug burn." Patient reports nodule felt under lumpectomy incision remains tender and has not changed in size with doxycycline. Reports she completed her doxycycline script approximately one week ago. Reports her left breast remains very tender but, denies pain at this time. Scheduled to follow up with Marlou Starks on 1/13 and Humphrey Rolls 3/16. Humphrey Rolls has already given patient script for anti-estrogen in the event radiation is not needed.

## 2013-06-21 NOTE — Progress Notes (Signed)
Complete PATIENT MEASURE OF DISTRESS worksheet with a score of 5 submitted to social work.  

## 2013-06-25 ENCOUNTER — Encounter (INDEPENDENT_AMBULATORY_CARE_PROVIDER_SITE_OTHER): Payer: Self-pay | Admitting: General Surgery

## 2013-06-25 ENCOUNTER — Ambulatory Visit (INDEPENDENT_AMBULATORY_CARE_PROVIDER_SITE_OTHER): Payer: BC Managed Care – PPO | Admitting: General Surgery

## 2013-06-25 VITALS — BP 120/82 | HR 64 | Temp 97.8°F | Resp 14 | Ht 63.5 in | Wt 163.4 lb

## 2013-06-25 DIAGNOSIS — D0500 Lobular carcinoma in situ of unspecified breast: Secondary | ICD-10-CM | POA: Insufficient documentation

## 2013-06-25 DIAGNOSIS — D0502 Lobular carcinoma in situ of left breast: Secondary | ICD-10-CM

## 2013-06-25 DIAGNOSIS — C50919 Malignant neoplasm of unspecified site of unspecified female breast: Secondary | ICD-10-CM

## 2013-06-25 NOTE — Progress Notes (Signed)
Subjective:     Patient ID: Savannah Lang, female   DOB: [birth year], 58 y.o.   MRN: [id number]  HPI The patient is a 58 year old female who is about one month status post left breast lumpectomy. Her original core biopsy showed DCIS. Her final pathology showed only LCIS. She returns today with some throbbing pains and some occasional sharp pains in the left breast. She denies any fevers or chills.  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 She will be meeting with radiation oncology in the near future to discuss whether she should have radiation therapy. She will also be meeting with medical oncology to discuss antiestrogen therapy. I will plan to see her back in the next couple weeks to check her progress

## 2013-06-25 NOTE — Progress Notes (Signed)
Subjective:     Patient ID: Savannah Lang, female   DOB: [birth year], 58 y.o.   MRN: [id number]  HPI The patient is a 58 year old white female who is 2 months status post left breast lumpectomy for LCIS. She tolerated the surgery well. She has one small area of sensitivity along the medial portion of the incision where there is some nodularity.  Review of Systems     Objective:   Physical Exam On exam her left breast and axilla incisions have healed nicely with no sign of infection or significant seroma. She does have one small 1 cm area of firmness along the medial portion of the incision. I suspect this may be an area of fat necrosis.    Assessment:     The patient is 2 months status post left breast lumpectomy for LCIS     Plan:     At this point she will continue to do regular self exams. She will start tamoxifen as a chemoprevention regimen. We'll plan to see her back in about 3 months to check her progress

## 2013-06-25 NOTE — Patient Instructions (Signed)
Continue regular self exams  

## 2013-07-02 ENCOUNTER — Encounter: Payer: Self-pay | Admitting: Oncology

## 2013-08-26 ENCOUNTER — Telehealth: Payer: Self-pay | Admitting: *Deleted

## 2013-08-26 ENCOUNTER — Ambulatory Visit (HOSPITAL_BASED_OUTPATIENT_CLINIC_OR_DEPARTMENT_OTHER): Payer: BC Managed Care – PPO | Admitting: Oncology

## 2013-08-26 ENCOUNTER — Encounter: Payer: Self-pay | Admitting: Oncology

## 2013-08-26 VITALS — BP 123/78 | HR 84 | Temp 98.3°F | Resp 18 | Ht 63.5 in | Wt 166.5 lb

## 2013-08-26 DIAGNOSIS — D0590 Unspecified type of carcinoma in situ of unspecified breast: Secondary | ICD-10-CM

## 2013-08-26 DIAGNOSIS — Z170 Estrogen receptor positive status [ER+]: Secondary | ICD-10-CM

## 2013-08-26 DIAGNOSIS — D0510 Intraductal carcinoma in situ of unspecified breast: Secondary | ICD-10-CM

## 2013-08-26 DIAGNOSIS — C50412 Malignant neoplasm of upper-outer quadrant of left female breast: Secondary | ICD-10-CM

## 2013-08-26 NOTE — Telephone Encounter (Signed)
appts made and printed...td 

## 2013-08-26 NOTE — Progress Notes (Signed)
OFFICE PROGRESS NOTE  CC  Savannah Heck, MD [place] [id number] Dr. Autumn Messing Dr. 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 She went on to have a lumpectomy performed on 05/03/2013. The final pathology revealed lobular neoplasia (lobular carcinoma in situ) with associated microcalcifications. 2 sentinel nodes were negative for metastatic disease  2. patient was seen by Dr. Thea Silversmith in January 2015. She did not recommend any radiation therapy because the tumor was an LCIS. This case was extensively discussed with the pathologist. He felt that the patient did not have DCIS but in fact had LCIS.  3. Currently receiving tamoxifen 20 mg daily for future breast cancer prevention. Total of 5 years of therapy is planned.   CURRENT THERAPY: tamoxifen 20 mg daily  INTERVAL HISTORY: Savannah Lang 58 y.o. female returns for followup visit today. She did begin tamoxifen back in January 2015. So far she is tolerating it well without any significant problems. She is denying any headaches double vision blurring of vision fevers chills or night sweats. She does have some hot flashes. She is denying any myalgias and arthralgias. No vaginal bleeding or discharge no swelling in her lower extremities. She has minimal breast tenderness. Remainder of the 10 point review of systems is negative  MEDICAL HISTORY: Past Medical History  Diagnosis Date  . Thyroid disease   . Headache(784.0)     migraines  . Breast cancer     ALLERGIES:  is allergic to codeine; oxycodone; sulfa antibiotics; and contrast media.  MEDICATIONS:  Current Outpatient Prescriptions  Medication Sig Dispense Refill  . Cholecalciferol (VITAMIN D-3 PO)  Take 2,000 Units by mouth daily.       Marland Kitchen levothyroxine (SYNTHROID, LEVOTHROID) 50 MCG tablet Take 50 mcg by mouth daily before breakfast.      . tamoxifen (NOLVADEX) 20 MG tablet        No current facility-administered medications for this visit.    SURGICAL HISTORY:  Past Surgical History  Procedure Laterality Date  . Tubal ligation    . Lasik    . Wisdom tooth extraction    . Fracture surgery Right     wrist and ankle  . Breast lumpectomy with needle localization and axillary sentinel lymph node bx Left 05/03/2013    Procedure: BREAST LUMPECTOMY WITH NEEDLE LOCALIZATION AND AXILLARY SENTINEL LYMPH NODE BX;  Surgeon: Merrie Roof, MD;  Location: [facility];  Service: General;  Laterality: Left;    REVIEW OF SYSTEMS:  Pertinent items are noted in HPI.     PHYSICAL EXAMINATION: There were no vitals taken for this visit. There is no weight on file to calculate BMI. ECOG PERFORMANCE STATUS: 0 - Asymptomatic  Well-developed nourished female in no acute distress HEENT exam: EOMI PERRLA sclerae anicteric no conjunctival pallor oral mucosa is moist neck supple no palpable cervical supraclavicular or axillary adenopathy Lungs: Clear to auscultation and percussion Cardiovascular: Regular rate rhythm no murmurs gallops or rubs Abdomen: Soft nontender nondistended bowel sounds are present no hepatosplenomegaly or palpable masses Extremities: No edema clubbing or cyanosis, pedal pulses are present Neuro: Alert oriented x3 DTRs +4 strength is symmetrical in upper and lower extremities, gait normal, but is grossly normal Skin: Warm and moist, good capillary filling,  no rashes. Breasts:  right breast normal without mass, skin or nipple changes or axillary nodes, left breast normal without mass, skin or nipple changes or axillary nodes with well healed surgical scar.     LABORATORY DATA: Lab Results  Component Value Date   WBC 6.3 05/28/2013   HGB 13.2 05/28/2013   HCT 40.2 05/28/2013    MCV 93.6 05/28/2013   PLT 282 05/28/2013      Chemistry      Component Value Date/Time   NA 141 05/28/2013 1209   NA 140 04/24/2013 1450   K 3.5 05/28/2013 1209   K 3.8 04/24/2013 1450   CL 101 04/24/2013 1450   CO2 27 05/28/2013 1209   CO2 28 04/24/2013 1450   BUN 7.9 05/28/2013 1209   BUN 11 04/24/2013 1450   CREATININE 0.8 05/28/2013 1209   CREATININE 0.80 04/24/2013 1450      Component Value Date/Time   CALCIUM 10.0 05/28/2013 1209   CALCIUM 9.8 04/24/2013 1450   ALKPHOS 80 05/28/2013 1209   ALKPHOS 83 04/24/2013 1450   AST 30 05/28/2013 1209   AST 24 04/24/2013 1450   ALT 46 05/28/2013 1209   ALT 28 04/24/2013 1450   BILITOT 0.40 05/28/2013 1209   BILITOT 0.2* 04/24/2013 1450       RADIOGRAPHIC STUDIES:  No results found.  ASSESSMENT/PLAN:  58 year old female with  1. DCIS/LCIS. DCIS was ER positive. Definitive diagnosis is LCIS. Postoperatively patient is doing well and is without any complaints. Patient and I discussed adjuvant treatment options. This would include antiestrogen therapy consisting of tamoxifen 20 mg daily to prevent future breast cancer risk. We discussed the side effects of tamoxifen including but not limited to blood clots orally cataracts hot flashes night sweats mood swings weight gain possibly uterine cancer.she began this back in January 2015. So far tolerating it well. No side effects reported other than hot flashes. We will continue it for 5 years.  2. Follow up: 6 months with blood work   All questions were answered. The patient knows to call the clinic with any problems, questions or concerns. We can certainly see the patient much sooner if necessary.  I spent 20 minutes counseling the patient face to face. The total time spent in the appointment was 25 minutes.    Marcy Panning, MD Medical/Oncology [facility] [phone] (beeper) [phone] (Office)  08/26/2013, 3:23 PM

## 2013-10-07 ENCOUNTER — Ambulatory Visit (INDEPENDENT_AMBULATORY_CARE_PROVIDER_SITE_OTHER): Payer: BC Managed Care – PPO | Admitting: General Surgery

## 2013-10-07 ENCOUNTER — Encounter (INDEPENDENT_AMBULATORY_CARE_PROVIDER_SITE_OTHER): Payer: Self-pay | Admitting: General Surgery

## 2013-10-07 VITALS — BP 118/74 | HR 73 | Temp 97.8°F | Ht 63.0 in | Wt 161.2 lb

## 2013-10-07 DIAGNOSIS — D0590 Unspecified type of carcinoma in situ of unspecified breast: Secondary | ICD-10-CM

## 2013-10-07 DIAGNOSIS — D0500 Lobular carcinoma in situ of unspecified breast: Secondary | ICD-10-CM

## 2013-10-07 NOTE — Patient Instructions (Signed)
Continue regular self exams  

## 2013-10-07 NOTE — Progress Notes (Signed)
Subjective:     Patient ID: Savannah Lang, female   DOB: 1955/09/30, 58 y.o.   MRN: [id number]  HPI The patient is a 58 year old white female who is about 6 months status post left breast lumpectomy for LCIS. She has done well since her last visit. The sensitivities she was having is improving. She denies any breast pain. She denies any discharge from her nipple. She is taking tamoxifen and tolerating it well.  Review of Systems  Constitutional: Negative.   HENT: Negative.   Eyes: Negative.   Respiratory: Negative.   Cardiovascular: Negative.   Gastrointestinal: Negative.   Endocrine: Negative.   Genitourinary: Negative.   Musculoskeletal: Negative.   Skin: Negative.   Allergic/Immunologic: Negative.   Neurological: Negative.   Hematological: Negative.   Psychiatric/Behavioral: Negative.        Objective:   Physical Exam  Constitutional: She is oriented to person, place, and time. She appears well-developed and well-nourished.  HENT:  Head: Normocephalic and atraumatic.  Eyes: Conjunctivae and EOM are normal. Pupils are equal, round, and reactive to light.  Neck: Normal range of motion. Neck supple.  Cardiovascular: Normal rate, regular rhythm and normal heart sounds.   Pulmonary/Chest: Effort normal and breath sounds normal.  Although I can feel the edge of the lumpectomy cavity in the upper left breast there is no palpable mass in either breast. There is no palpable axillary, supraclavicular, or cervical lymphadenopathy.  Abdominal: Soft. Bowel sounds are normal.  Musculoskeletal: Normal range of motion.  Lymphadenopathy:    She has no cervical adenopathy.  Neurological: She is alert and oriented to person, place, and time.  Skin: Skin is warm and dry.  Psychiatric: She has a normal mood and affect. Her behavior is normal.       Assessment:     The patient is 6 months status post left breast lumpectomy for LCIS     Plan:     At this point she will continue to do  regular self exams. She will continue to take tamoxifen. I will plan to see her back in about 6 months.

## 2013-11-14 ENCOUNTER — Other Ambulatory Visit: Payer: Self-pay

## 2013-11-14 ENCOUNTER — Other Ambulatory Visit: Payer: Self-pay | Admitting: Obstetrics and Gynecology

## 2013-11-14 DIAGNOSIS — Z853 Personal history of malignant neoplasm of breast: Secondary | ICD-10-CM

## 2013-12-03 ENCOUNTER — Ambulatory Visit
Admission: RE | Admit: 2013-12-03 | Discharge: 2013-12-03 | Disposition: A | Discharge status: Home / Self Care | Payer: BC Managed Care – PPO | Source: Ambulatory Visit | Attending: Obstetrics and Gynecology | Admitting: Obstetrics and Gynecology

## 2013-12-03 DIAGNOSIS — Z853 Personal history of malignant neoplasm of breast: Secondary | ICD-10-CM

## 2014-02-28 ENCOUNTER — Telehealth: Payer: Self-pay | Admitting: Hematology and Oncology

## 2014-03-05 ENCOUNTER — Other Ambulatory Visit: Payer: BC Managed Care – PPO

## 2014-03-05 ENCOUNTER — Ambulatory Visit: Payer: BC Managed Care – PPO | Admitting: Oncology

## 2014-03-06 ENCOUNTER — Other Ambulatory Visit: Payer: Self-pay | Admitting: Family Medicine

## 2014-03-06 DIAGNOSIS — R7989 Other specified abnormal findings of blood chemistry: Secondary | ICD-10-CM

## 2014-03-06 DIAGNOSIS — R945 Abnormal results of liver function studies: Principal | ICD-10-CM

## 2014-03-11 ENCOUNTER — Telehealth: Payer: Self-pay | Admitting: *Deleted

## 2014-03-11 ENCOUNTER — Ambulatory Visit
Admission: RE | Admit: 2014-03-11 | Discharge: 2014-03-11 | Disposition: A | Discharge status: Home / Self Care | Payer: BC Managed Care – PPO | Source: Ambulatory Visit | Attending: Family Medicine | Admitting: Family Medicine

## 2014-03-11 DIAGNOSIS — R7989 Other specified abnormal findings of blood chemistry: Secondary | ICD-10-CM

## 2014-03-11 DIAGNOSIS — R945 Abnormal results of liver function studies: Principal | ICD-10-CM

## 2014-03-11 NOTE — Telephone Encounter (Signed)
Received copy of labs done at [facility] on 03/04/14. Original sent to HIM to be scanned and copy given to Dr. Lindi Adie for review.

## 2014-03-31 ENCOUNTER — Telehealth: Payer: Self-pay | Admitting: Hematology and Oncology

## 2014-03-31 ENCOUNTER — Other Ambulatory Visit (HOSPITAL_BASED_OUTPATIENT_CLINIC_OR_DEPARTMENT_OTHER): Payer: BC Managed Care – PPO

## 2014-03-31 ENCOUNTER — Ambulatory Visit (HOSPITAL_BASED_OUTPATIENT_CLINIC_OR_DEPARTMENT_OTHER): Payer: BC Managed Care – PPO | Admitting: Hematology and Oncology

## 2014-03-31 VITALS — BP 124/84 | HR 99 | Temp 98.5°F | Resp 18 | Ht 63.0 in | Wt 163.8 lb

## 2014-03-31 DIAGNOSIS — C50412 Malignant neoplasm of upper-outer quadrant of left female breast: Secondary | ICD-10-CM

## 2014-03-31 DIAGNOSIS — Z853 Personal history of malignant neoplasm of breast: Secondary | ICD-10-CM

## 2014-03-31 LAB — CBC WITH DIFFERENTIAL/PLATELET
BASO%: 0.3 % (ref 0.0–2.0)
BASOS ABS: 0 10*3/uL (ref 0.0–0.1)
EOS%: 1 % (ref 0.0–7.0)
Eosinophils Absolute: 0.1 10*3/uL (ref 0.0–0.5)
HCT: 40.7 % (ref 34.8–46.6)
HEMOGLOBIN: 13.4 g/dL (ref 11.6–15.9)
LYMPH#: 3 10*3/uL (ref 0.9–3.3)
LYMPH%: 51.7 % — ABNORMAL HIGH (ref 14.0–49.7)
MCH: 31.1 pg (ref 25.1–34.0)
MCHC: 32.9 g/dL (ref 31.5–36.0)
MCV: 94.4 fL (ref 79.5–101.0)
MONO#: 0.6 10*3/uL (ref 0.1–0.9)
MONO%: 10.4 % (ref 0.0–14.0)
NEUT#: 2.1 10*3/uL (ref 1.5–6.5)
NEUT%: 36.6 % — AB (ref 38.4–76.8)
Platelets: 271 10*3/uL (ref 145–400)
RBC: 4.31 10*6/uL (ref 3.70–5.45)
RDW: 12.7 % (ref 11.2–14.5)
WBC: 5.8 10*3/uL (ref 3.9–10.3)

## 2014-03-31 LAB — COMPREHENSIVE METABOLIC PANEL (CC13)
ALK PHOS: 67 U/L (ref 40–150)
ALT: 125 U/L — AB (ref 0–55)
AST: 67 U/L — AB (ref 5–34)
Albumin: 3.7 g/dL (ref 3.5–5.0)
Anion Gap: 8 mEq/L (ref 3–11)
BUN: 13.9 mg/dL (ref 7.0–26.0)
CHLORIDE: 105 meq/L (ref 98–109)
CO2: 26 mEq/L (ref 22–29)
CREATININE: 0.8 mg/dL (ref 0.6–1.1)
Calcium: 10.1 mg/dL (ref 8.4–10.4)
Glucose: 94 mg/dl (ref 70–140)
Potassium: 4.3 mEq/L (ref 3.5–5.1)
Sodium: 140 mEq/L (ref 136–145)
Total Bilirubin: 0.5 mg/dL (ref 0.20–1.20)
Total Protein: 6.9 g/dL (ref 6.4–8.3)

## 2014-03-31 NOTE — Progress Notes (Signed)
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 She went on to have a lumpectomy performed on 05/03/2013. The final pathology revealed lobular neoplasia (lobular carcinoma in situ) with associated microcalcifications. 2 sentinel nodes were negative for metastatic disease  2. patient was seen by Dr. Thea Silversmith in January 2015. She did not recommend any radiation therapy because the tumor was an LCIS. This case was extensively discussed with the pathologist. He felt that the patient did not have DCIS but in fact had LCIS.  3. tried taking tamoxifen 20 mg daily stopped July 2015 due to myalgias and muscle cramps   CHIEF COMPLIANT: Followup of LCIS  INTERVAL HISTORY: Savannah Lang is a 58 year old Caucasian with above-mentioned history of LCIS involving the left breast treated with lumpectomy did not require radiation therapy. She tried taking tamoxifen for 6 months and she stopped taking it because of myalgias and muscle cramps. She saw her gynecologist who wanted her to discuss this once again with [place]. She has been counseled extensively in the past about risks and benefits of tamoxifen. She had mammograms in June 2015 which were normal.  REVIEW OF SYSTEMS:   Constitutional: Denies fevers, chills or abnormal weight loss Eyes: Denies blurriness of vision Ears, nose, mouth, throat, and face: Denies mucositis or sore throat Respiratory: Denies cough, dyspnea or wheezes Cardiovascular: Denies palpitation, chest discomfort or lower extremity swelling Gastrointestinal:  Denies nausea, heartburn or change in bowel habits Skin: Denies abnormal skin rashes Lymphatics: Denies new lymphadenopathy or easy  bruising Neurological:Denies numbness, tingling or new weaknesses Behavioral/Psych: Mood is stable, no new changes  Breast:  denies any pain or lumps or nodules in either breasts All other systems were reviewed with the patient and are negative.  I have reviewed the past medical history, past surgical history, social history and family history with the patient and they are unchanged from previous note.  ALLERGIES:  is allergic to codeine; oxycodone; sulfa antibiotics; and contrast media.  MEDICATIONS:  Current Outpatient Prescriptions  Medication Sig Dispense Refill  . Cholecalciferol (VITAMIN D-3 PO) Take 2,000 Units by mouth daily.       Marland Kitchen levothyroxine (SYNTHROID, LEVOTHROID) 50 MCG tablet Take 50 mcg by mouth daily before breakfast.      . Vitamin D, Ergocalciferol, (DRISDOL) 50000 UNITS CAPS capsule Take 50,000 Units by mouth every 7 (seven) days.      . tamoxifen (NOLVADEX) 20 MG tablet        No current facility-administered medications for this visit.    PHYSICAL EXAMINATION: ECOG PERFORMANCE STATUS: 0 - Asymptomatic  Filed Vitals:   03/31/14 0953  BP: 124/84  Pulse: 99  Temp: 98.5 F (36.9 C)  Resp: 18   Filed Weights   03/31/14 0953  Weight: 163 lb 12.8 oz (74.299 kg)    GENERAL:alert, no distress and comfortable SKIN: skin color, texture, turgor are normal, no rashes or significant lesions EYES: normal, Conjunctiva are pink and non-injected, sclera clear OROPHARYNX:no exudate, no erythema and lips, buccal mucosa, and tongue normal  NECK: supple, thyroid normal size, non-tender, without nodularity LYMPH:  no palpable lymphadenopathy in the cervical, axillary or inguinal LUNGS: clear to auscultation and percussion with normal breathing effort HEART: regular rate &  rhythm and no murmurs and no lower extremity edema ABDOMEN:abdomen soft, non-tender and normal bowel sounds Musculoskeletal:no cyanosis of digits and no clubbing  NEURO: alert & oriented x 3 with  fluent speech, no focal motor/sensory deficits  LABORATORY DATA:  I have reviewed the data as listed   Chemistry      Component Value Date/Time   NA 140 03/31/2014 1014   NA 140 04/24/2013 1450   K 4.3 03/31/2014 1014   K 3.8 04/24/2013 1450   CL 101 04/24/2013 1450   CO2 26 03/31/2014 1014   CO2 28 04/24/2013 1450   BUN 13.9 03/31/2014 1014   BUN 11 04/24/2013 1450   CREATININE 0.8 03/31/2014 1014   CREATININE 0.80 04/24/2013 1450      Component Value Date/Time   CALCIUM 10.1 03/31/2014 1014   CALCIUM 9.8 04/24/2013 1450   ALKPHOS 67 03/31/2014 1014   ALKPHOS 83 04/24/2013 1450   AST 67* 03/31/2014 1014   AST 24 04/24/2013 1450   ALT 125* 03/31/2014 1014   ALT 28 04/24/2013 1450   BILITOT 0.50 03/31/2014 1014   BILITOT 0.2* 04/24/2013 1450       Lab Results  Component Value Date   WBC 5.8 03/31/2014   HGB 13.4 03/31/2014   HCT 40.7 03/31/2014   MCV 94.4 03/31/2014   PLT 271 03/31/2014   NEUTROABS 2.1 03/31/2014     RADIOGRAPHIC STUDIES: I have personally reviewed the radiology reports and agreed with their findings. No results found.   ASSESSMENT & PLAN:  Breast cancer of upper-outer quadrant of left female breast LCIS: Patient had tried taking tamoxifen for 7 months developed severe muscle cramps and decided to stop taking it. I discussed with her the risks of LCIS in terms of recurrent LCIS or other precancerous lesions or breast cancer. I also discussed that tamoxifen would reduce the risks and half. The risk of recurrence is around 1% per year and that by taking tamoxifen that would be reduced in half. Patient understands to be lifetime risk of breast brownish vomitus 4.5% and having LCIS does increase the risk upto :30 to 40% After listening to this discussion, patient wanted to do surveillance and not to have to take tamoxifen. I discussed alternatives to tamoxifen including lower dosage of tamoxifen but she is planning to do her surveillance and not go on  any breast reduction strategies.  Return to clinic in July 2016 for her breast exam and followup after the mammograms. Patient's breast density was category B and I counseled her about breast density.    Orders Placed This Encounter  Procedures  . MM Digital Diagnostic Bilat    EPIC ORDER PF: 12/03/2013 BCG  NO NEEDS  CR/CLEMEISA  BCBS       Standing Status: Future     Number of Occurrences:      Standing Expiration Date: 03/31/2015    Order Specific Question:  Reason for Exam (SYMPTOM  OR DIAGNOSIS REQUIRED)    Answer:  LCIS breast annual follow up    Order Specific Question:  Is the patient pregnant?    Answer:  No    Order Specific Question:  Preferred imaging location?    Answer:  [facility]  . CBC with Differential    Standing Status: Future     Number of Occurrences: 1     Standing Expiration Date: 03/31/2015  . Comprehensive metabolic panel (Cmet) - [facility]    Standing Status: Future     Number of Occurrences: 1  Standing Expiration Date: 03/31/2015  . CBC with Differential    Standing Status: Future     Number of Occurrences:      Standing Expiration Date: 03/31/2015  . Comprehensive metabolic panel (Cmet) - [facility]    Standing Status: Future     Number of Occurrences:      Standing Expiration Date: 03/31/2015   The patient has a good understanding of the overall plan. she agrees with it. She will call with any problems that may develop before her next visit here.  I spent 15 minutes counseling the patient face to face. The total time spent in the appointment was 15 minutes and more than 50% was on counseling and review of test results    Rulon Eisenmenger, MD 03/31/2014 11:02 AM

## 2014-03-31 NOTE — Telephone Encounter (Signed)
per pof to sch pt appt-gave pt copy of sch-sch mamma- °

## 2014-03-31 NOTE — Assessment & Plan Note (Signed)
LCIS: Patient had tried taking tamoxifen for 7 months developed severe muscle cramps and decided to stop taking it. I discussed with her the risks of LCIS in terms of recurrent LCIS or other precancerous lesions or breast cancer. I also discussed that tamoxifen would reduce the risks and half. The risk of recurrence is around 1% per year and that by taking tamoxifen that would be reduced in half. Patient understands to be lifetime risk of breast brownish vomitus 4.5% and having LCIS does increase the risk upto :30 to 40% After listening to this discussion, patient wanted to do surveillance and not to have to take tamoxifen. I discussed alternatives to tamoxifen including lower dosage of tamoxifen but she is planning to do her surveillance and not go on any breast reduction strategies.  Return to clinic in July 2016 for her breast exam and followup after the mammograms. Patient's breast density was category B and I counseled her about breast density.

## 2014-05-10 IMAGING — MG MM LT BREAST BX W LOC DEV 1ST LESION IMAGE BX SPEC STEREO GUIDE
3 series · 3 of 3 positions shown · non-contrast
Comparison: Previous exams.

ADDENDUM:
Carcinoma in situ the was reported histologically. Surgical excision
is recommended. Surgical consultation has been scheduled with Dr.
Balon Alvarado on 04/11/2013. The patient was contacted by telephone and given
the results of the biopsy. She states the biopsy site is clean and
dry with no significant hematoma or signs of infection.
CLINICAL DATA: Left breast calcifications

EXAM:
STEREOTACTIC CORE NEEDLE BIOPSY

[L ML]
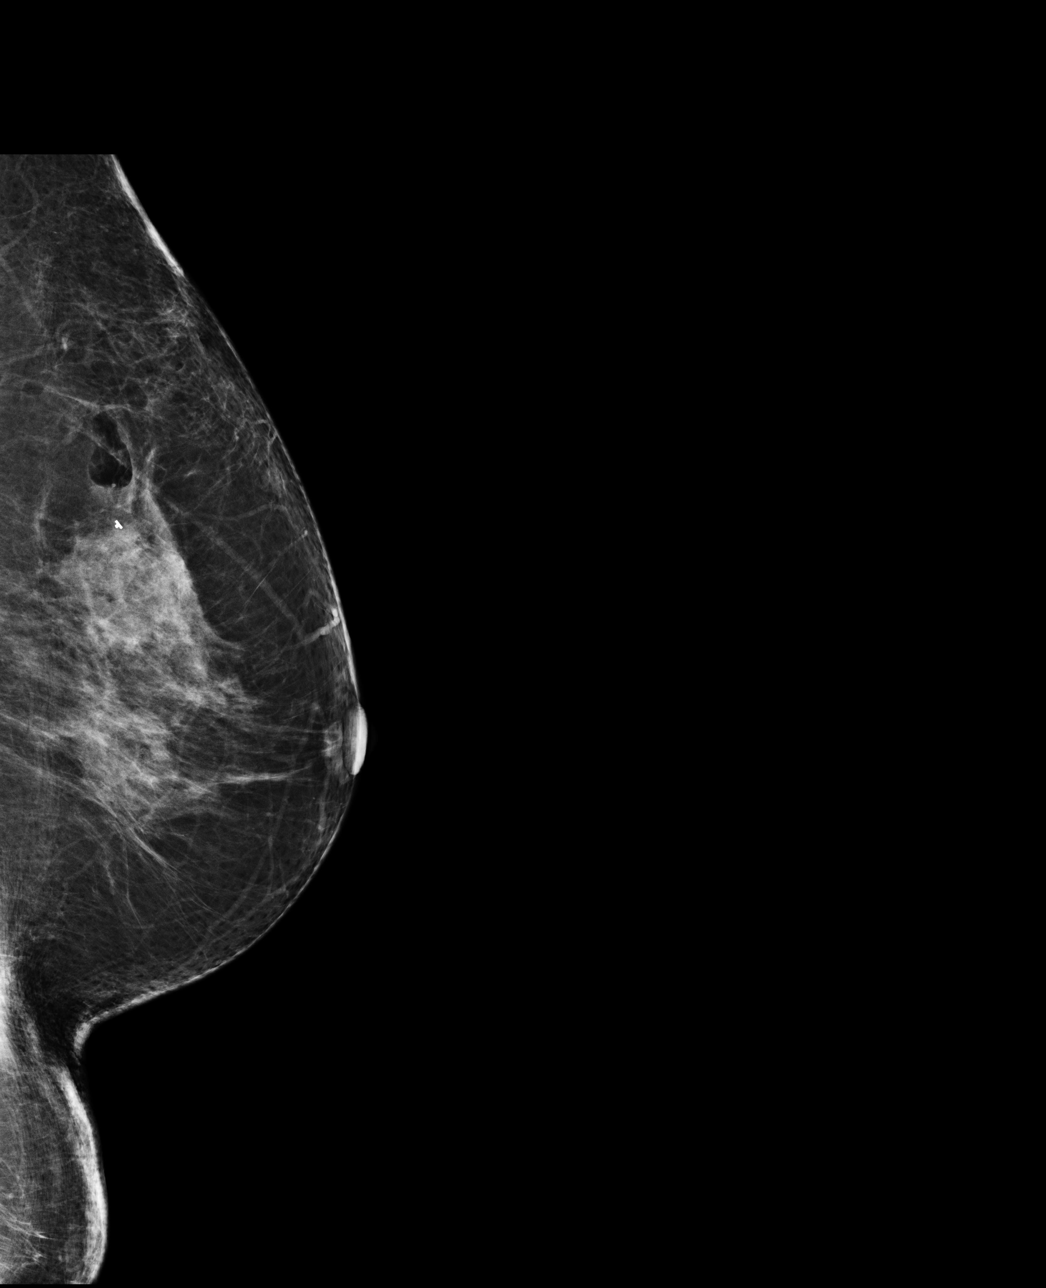

[L CC]
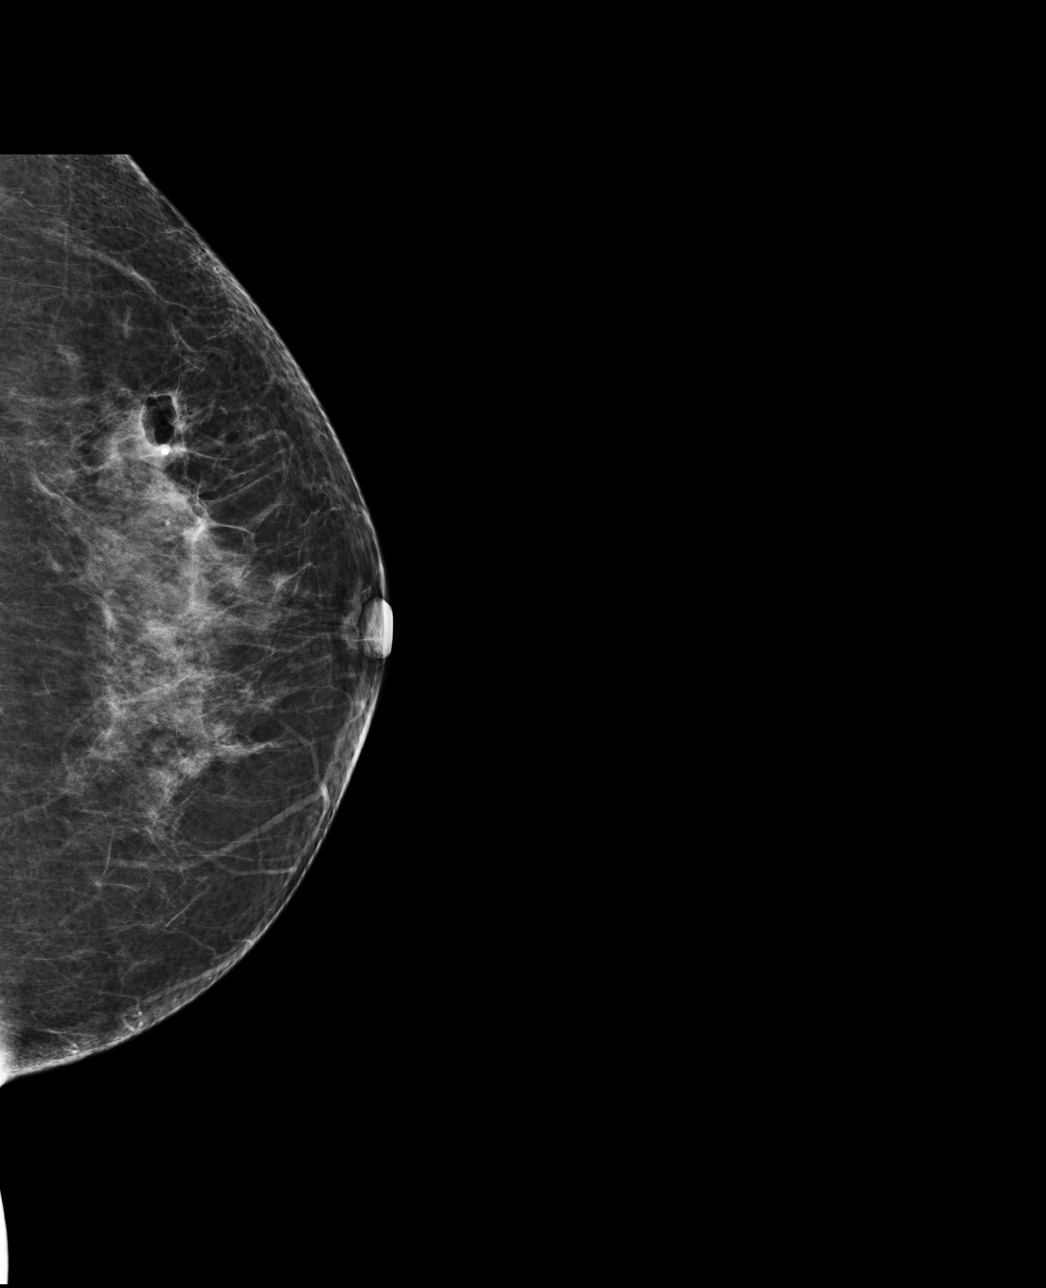

[L SPECIMEN]
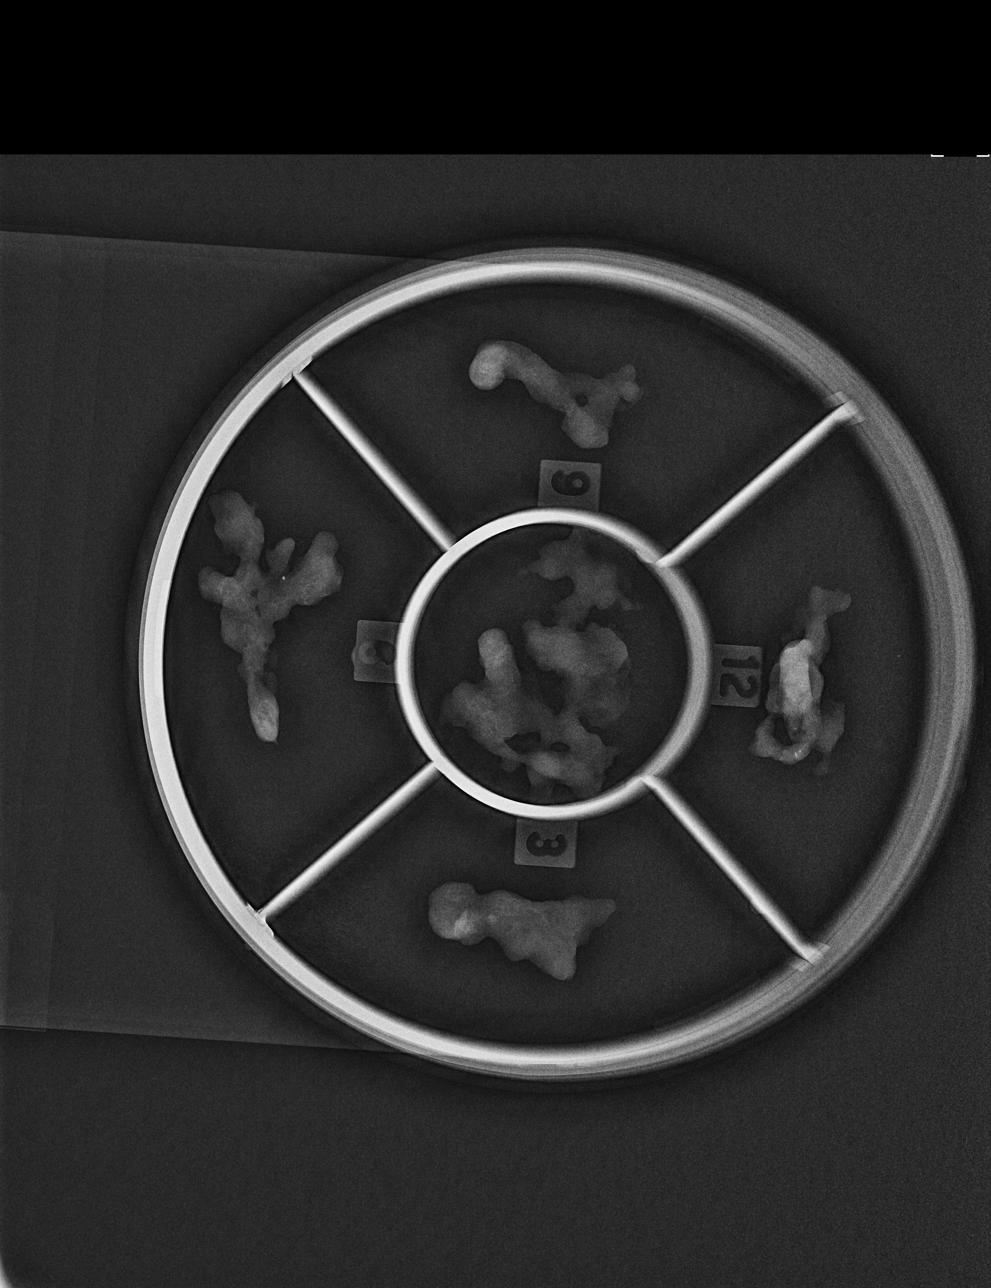

[3 of 3 positions shown; findings below may reference images not displayed]

FINDINGS: I met with the patient and we discussed the procedure of
stereotactic-guided biopsy, including benefits and alternatives. We
discussed the high likelihood of a successful procedure. We
discussed the risks of the procedure, including infection, bleeding,
tissue injury, clip migration, and inadequate sampling. Informed,
written consent was given.

Using sterile technique and 2% Lidocaine as local anesthetic, under
stereotactic guidance, a 9 gauge vacuum assisted core biopsy device
was used to perform core needle biopsy of calcifications in the
upper-outer quadrant of the left breast middle depth using a
superior approach. Specimen radiograph was performed, showing
calcifications. Specimens with calcifications are identified for
pathology.

At the conclusion of the procedure, a top hat shaped tissue marker
clip was deployed into the biopsy cavity. Follow-up 2-view mammogram
confirmed clip in appropriate position.
IMPRESSION: Stereotactic-guided biopsy of left breast calcifications. No
apparent complications.

## 2014-11-19 ENCOUNTER — Ambulatory Visit (INDEPENDENT_AMBULATORY_CARE_PROVIDER_SITE_OTHER): Payer: BLUE CROSS/BLUE SHIELD | Admitting: Physician Assistant

## 2014-11-19 VITALS — BP 118/74 | HR 62 | Temp 98.5°F | Resp 16 | Ht 65.0 in | Wt 146.0 lb

## 2014-11-19 DIAGNOSIS — H6121 Impacted cerumen, right ear: Secondary | ICD-10-CM

## 2014-11-19 NOTE — Progress Notes (Signed)
[facility] [address] [place] [address] [phone]  Date:  11/19/2014   Name:  Savannah Lang   DOB:  09-13-1955   MRN:  [id number]  PCP:  Gerrit Heck, MD    History of Present Illness:  Savannah Lang is a 59 y.o. female patient who presents to [facility] for chief complaint of sensation of her right ear clogged.  She states that this started a couple weeks ago.  Last night the clogged sensation progressively worsened when she would press on her ear he would feel squishy.  She states that her hearing is worse in this right ear.  She denies dizziness, tinnitus, ear pain, or drainage.     Patient Active Problem List   Diagnosis Date Noted  . Breast neoplasm, Tis (LCIS) 06/25/2013  . Postoperative wound cellulitis 05/31/2013  . Breast cancer of upper-outer quadrant of left female breast 05/27/2013  . DCIS (ductal carcinoma in situ) 04/11/2013    Past Medical History  Diagnosis Date  . Thyroid disease   . Headache(784.0)     migraines  . Breast cancer     Past Surgical History  Procedure Laterality Date  . Tubal ligation    . Lasik    . Wisdom tooth extraction    . Fracture surgery Right     wrist and ankle  . Breast lumpectomy with needle localization and axillary sentinel lymph node bx Left 05/03/2013    Procedure: BREAST LUMPECTOMY WITH NEEDLE LOCALIZATION AND AXILLARY SENTINEL LYMPH NODE BX;  Surgeon: Merrie Roof, MD;  Location: [place];  Service: General;  Laterality: Left;  . Uterine polop      11/15    History  Substance Use Topics  . Smoking status: Former Smoker -- 1.00 packs/day for 31 years    Types: Cigarettes    Quit date: 06/24/1981  . Smokeless tobacco: Never Used  . Alcohol Use: No    Family History  Problem Relation Age of Onset  . Cancer Father     father  . Cancer Maternal Aunt     breast  . Cancer Maternal Grandfather     pancreas  . Cancer Paternal Grandfather     kidney    Allergies  Allergen  Reactions  . Codeine     Unknown to pt  . Oxycodone Hives  . Sulfa Antibiotics     Unknown to pt  . Contrast Media [Iodinated Diagnostic Agents] Hives    Medication list has been reviewed and updated.  Current Outpatient Prescriptions on File Prior to Visit  Medication Sig Dispense Refill  . Cholecalciferol (VITAMIN D-3 PO) Take 2,000 Units by mouth daily.     . Vitamin D, Ergocalciferol, (DRISDOL) 50000 UNITS CAPS capsule Take 50,000 Units by mouth every 7 (seven) days.    Marland Kitchen levothyroxine (SYNTHROID, LEVOTHROID) 50 MCG tablet Take 50 mcg by mouth daily before breakfast.     No current facility-administered medications on file prior to visit.    ROS ROS otherwise unremarkable unless listed above.    Physical Examination: BP 118/74 mmHg  Pulse 62  Temp(Src) 98.5 F (36.9 C) (Oral)  Resp 16  Ht 5\' 5"  (1.651 m)  Wt 146 lb (66.225 kg)  BMI 24.30 kg/m2  SpO2 97% Ideal Body Weight: Weight in (lb) to have BMI = 25: 149.9  Physical Exam  Constitutional: She is oriented to person, place, and time. She appears well-developed and well-nourished.  HENT:  Head: Normocephalic and  atraumatic.  Right Ear: External ear normal.  Left Ear: Tympanic membrane, external ear and ear canal normal.  TM unable to visualized due to significant cerumen impaction of the right ear.  External ear is normal wihout mastoiditis pre-auricular or post-auricular adenopathy.    Eyes: Conjunctivae are normal. Pupils are equal, round, and reactive to light.  Neck: Normal range of motion.  Pulmonary/Chest: Effort normal and breath sounds normal. No respiratory distress.  Lymphadenopathy:    She has no cervical adenopathy.  Neurological: She is alert and oriented to person, place, and time.  Skin: Skin is warm and dry.  Psychiatric: She has a normal mood and affect. Her behavior is normal.   Ear irrigation of the right ear: with normal TM visualized.    Assessment and Plan: 59 year old female with PMH  listed above is hear today for ear clogged sensation.    1. Cerumen impaction, right Cleared, advised to rtc if symptoms return.  Instructed on appropriate cleaning mainatenance to avoid damage of tm.   Ivar Drape, PA-C [facility] 11/19/2014 11:48 AM

## 2014-11-19 NOTE — Patient Instructions (Signed)
Cerumen Impaction °A cerumen impaction is when the wax in your ear forms a plug. This plug usually causes reduced hearing. Sometimes it also causes an earache or dizziness. Removing a cerumen impaction can be difficult and painful. The wax sticks to the ear canal. The canal is sensitive and bleeds easily. If you try to remove a heavy wax buildup with a cotton tipped swab, you may push it in further. °Irrigation with water, suction, and small ear curettes may be used to clear out the wax. If the impaction is fixed to the skin in the ear canal, ear drops may be needed for a few days to loosen the wax. People who build up a lot of wax frequently can use ear wax removal products available in your local drugstore. °SEEK MEDICAL CARE IF:  °You develop an earache, increased hearing loss, or marked dizziness. °Document Released: 07/07/2004 Document Revised: 08/22/2011 Document Reviewed: 08/27/2009 °ExitCare® Patient Information ©2015 [employer]. This information is not intended to replace advice given to you by your health care provider. Make sure you discuss any questions you have with your health care provider. ° °

## 2014-12-08 ENCOUNTER — Other Ambulatory Visit: Payer: Self-pay | Admitting: Hematology and Oncology

## 2014-12-08 ENCOUNTER — Ambulatory Visit
Admission: RE | Admit: 2014-12-08 | Discharge: 2014-12-08 | Disposition: A | Discharge status: Home / Self Care | Payer: BLUE CROSS/BLUE SHIELD | Source: Ambulatory Visit | Attending: Hematology and Oncology | Admitting: Hematology and Oncology

## 2014-12-08 DIAGNOSIS — Z1231 Encounter for screening mammogram for malignant neoplasm of breast: Secondary | ICD-10-CM

## 2014-12-08 DIAGNOSIS — C50412 Malignant neoplasm of upper-outer quadrant of left female breast: Secondary | ICD-10-CM

## 2014-12-30 ENCOUNTER — Ambulatory Visit (HOSPITAL_BASED_OUTPATIENT_CLINIC_OR_DEPARTMENT_OTHER): Payer: BLUE CROSS/BLUE SHIELD | Admitting: Hematology and Oncology

## 2014-12-30 ENCOUNTER — Encounter: Payer: Self-pay | Admitting: Hematology and Oncology

## 2014-12-30 ENCOUNTER — Other Ambulatory Visit (HOSPITAL_BASED_OUTPATIENT_CLINIC_OR_DEPARTMENT_OTHER): Payer: BLUE CROSS/BLUE SHIELD

## 2014-12-30 VITALS — BP 122/72 | HR 72 | Temp 98.3°F | Resp 18 | Ht 65.0 in | Wt 149.3 lb

## 2014-12-30 DIAGNOSIS — D0502 Lobular carcinoma in situ of left breast: Secondary | ICD-10-CM | POA: Diagnosis not present

## 2014-12-30 DIAGNOSIS — C50412 Malignant neoplasm of upper-outer quadrant of left female breast: Secondary | ICD-10-CM

## 2014-12-30 LAB — CBC WITH DIFFERENTIAL/PLATELET
BASO%: 0.6 % (ref 0.0–2.0)
BASOS ABS: 0 10*3/uL (ref 0.0–0.1)
EOS%: 2.1 % (ref 0.0–7.0)
Eosinophils Absolute: 0.1 10*3/uL (ref 0.0–0.5)
HCT: 38.1 % (ref 34.8–46.6)
HGB: 12.7 g/dL (ref 11.6–15.9)
LYMPH%: 49 % (ref 14.0–49.7)
MCH: 30.8 pg (ref 25.1–34.0)
MCHC: 33.2 g/dL (ref 31.5–36.0)
MCV: 92.7 fL (ref 79.5–101.0)
MONO#: 0.4 10*3/uL (ref 0.1–0.9)
MONO%: 7.6 % (ref 0.0–14.0)
NEUT#: 2.4 10*3/uL (ref 1.5–6.5)
NEUT%: 40.7 % (ref 38.4–76.8)
Platelets: 278 10*3/uL (ref 145–400)
RBC: 4.11 10*6/uL (ref 3.70–5.45)
RDW: 13.3 % (ref 11.2–14.5)
WBC: 5.9 10*3/uL (ref 3.9–10.3)
lymph#: 2.9 10*3/uL (ref 0.9–3.3)

## 2014-12-30 LAB — COMPREHENSIVE METABOLIC PANEL (CC13)
ALK PHOS: 82 U/L (ref 40–150)
ALT: 24 U/L (ref 0–55)
ANION GAP: 7 meq/L (ref 3–11)
AST: 14 U/L (ref 5–34)
Albumin: 3.6 g/dL (ref 3.5–5.0)
BUN: 11 mg/dL (ref 7.0–26.0)
CALCIUM: 9.3 mg/dL (ref 8.4–10.4)
CO2: 27 mEq/L (ref 22–29)
Chloride: 107 mEq/L (ref 98–109)
Creatinine: 0.8 mg/dL (ref 0.6–1.1)
EGFR: 79 mL/min/{1.73_m2} — ABNORMAL LOW (ref >90)
Glucose: 125 mg/dl (ref 70–140)
Potassium: 4 mEq/L (ref 3.5–5.1)
Sodium: 141 mEq/L (ref 136–145)
Total Bilirubin: 0.31 mg/dL (ref 0.20–1.20)
Total Protein: 6.5 g/dL (ref 6.4–8.3)

## 2014-12-30 NOTE — Progress Notes (Signed)
Patient Care Team: Leighton Ruff, MD as PCP - General (Family Medicine)  DIAGNOSIS: No matching staging information was found for the patient.  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 She is doing very well today without any problems or concerns. Denies any lumps or nodules in the breasts. She is planning to move to [place] in October to live closer to her family in [place]. She found a job as well.  REVIEW OF SYSTEMS:   Constitutional: Denies fevers, chills or abnormal weight loss Eyes: Denies blurriness of vision Ears, nose, mouth, throat, and face: Denies mucositis or sore throat Respiratory: Denies cough, dyspnea or wheezes Cardiovascular: Denies palpitation, chest discomfort or lower extremity swelling Gastrointestinal:  Denies nausea, heartburn or change in bowel habits Skin: Denies abnormal skin rashes Lymphatics: Denies new lymphadenopathy or easy bruising Neurological:Denies numbness, tingling or new weaknesses Behavioral/Psych: Mood is stable, no new changes  Breast:  denies any pain or lumps or nodules in either breasts All other systems were reviewed with the patient and are negative.  I have reviewed the past medical history, past surgical history, social history and family history with the patient and they are  unchanged from previous note.  ALLERGIES:  is allergic to codeine; oxycodone; sulfa antibiotics; and contrast media.  MEDICATIONS:  Current Outpatient Prescriptions  Medication Sig Dispense Refill  . Cholecalciferol (VITAMIN D-3 PO) Take 2,000 Units by mouth daily.     Marland Kitchen levothyroxine (SYNTHROID, LEVOTHROID) 50 MCG tablet Take 50 mcg by mouth daily before breakfast.     No current facility-administered medications for this visit.    PHYSICAL EXAMINATION: ECOG PERFORMANCE STATUS: 0 - Asymptomatic  Filed Vitals:   12/30/14 1522  BP: 122/72  Pulse: 72  Temp: 98.3 F (36.8 C)  Resp: 18   Filed Weights   12/30/14 1522  Weight: 149 lb 4.8 oz (67.722 kg)    GENERAL:alert, no distress and comfortable SKIN: skin color, texture, turgor are normal, no rashes or significant lesions EYES: normal, Conjunctiva are pink and non-injected, sclera clear OROPHARYNX:no exudate, no erythema and lips, buccal mucosa, and tongue normal  NECK: supple, thyroid normal size, non-tender, without nodularity LYMPH:  no palpable lymphadenopathy in the cervical, axillary or inguinal LUNGS: clear to auscultation and percussion with normal breathing effort HEART: regular rate & rhythm and no murmurs and no lower extremity edema ABDOMEN:abdomen soft, non-tender and normal bowel sounds Musculoskeletal:no cyanosis of digits and no clubbing  NEURO: alert & oriented x 3 with fluent speech, no focal motor/sensory deficits BREAST: No palpable masses or nodules in either right or left breasts. No palpable axillary supraclavicular or infraclavicular adenopathy no breast tenderness or nipple discharge. (exam performed in the presence of a chaperone)  LABORATORY DATA:  I have reviewed the data as listed   Chemistry      Component Value Date/Time   NA 141 12/30/2014 1511  NA 140 04/24/2013 1450   K 4.0 12/30/2014 1511   K 3.8 04/24/2013 1450   CL 101 04/24/2013 1450   CO2 27 12/30/2014 1511   CO2 28 04/24/2013  1450   BUN 11.0 12/30/2014 1511   BUN 11 04/24/2013 1450   CREATININE 0.8 12/30/2014 1511   CREATININE 0.80 04/24/2013 1450      Component Value Date/Time   CALCIUM 9.3 12/30/2014 1511   CALCIUM 9.8 04/24/2013 1450   ALKPHOS 82 12/30/2014 1511   ALKPHOS 83 04/24/2013 1450   AST 14 12/30/2014 1511   AST 24 04/24/2013 1450   ALT 24 12/30/2014 1511   ALT 28 04/24/2013 1450   BILITOT 0.31 12/30/2014 1511   BILITOT 0.2* 04/24/2013 1450       Lab Results  Component Value Date   WBC 5.9 12/30/2014   HGB 12.7 12/30/2014   HCT 38.1 12/30/2014   MCV 92.7 12/30/2014   PLT 278 12/30/2014   NEUTROABS 2.4 12/30/2014     RADIOGRAPHIC STUDIES: I have personally reviewed the radiology reports and agreed with their findings. Mammogram 12/08/2014 no abnormalities  ASSESSMENT & PLAN:  Breast cancer of upper-outer quadrant of left female breast Left breast LCIS status post lumpectomy 05/03/2013 on initial biopsy showed mammary carcinoma in situ ER 100% PR 100%, did not get radiation because it was LCIS. Attempted tamoxifen from December 2014 to July 2015 stopped due to myalgias and muscle cramps  Breast Cancer Surveillance: 1. Breast exam 12/30/2014: Normal 2. Mammogram 12/08/2014 No abnormalities. Postsurgical changes. Breast Density Category C. I recommended that she get 3-D mammograms for surveillance.  Patient is moving to [place] in October and will be setting up to see a primary care doctor gynecologist who can do the breast exams and mammograms annually.     No orders of the defined types were placed in this encounter.   The patient has a good understanding of the overall plan. she agrees with it. she will call with any problems that may develop before the next visit here.   Rulon Eisenmenger, MD

## 2014-12-30 NOTE — Assessment & Plan Note (Signed)
Left breast LCIS status post lumpectomy 05/03/2013 on initial biopsy showed mammary carcinoma in situ ER 100% PR 100%, did not get radiation because it was LCIS. Attempted tamoxifen from December 2014 to July 2015 stopped due to myalgias and muscle cramps  Breast Cancer Surveillance: 1. Breast exam 12/30/2014: Normal 2. Mammogram 12/08/2014 No abnormalities. Postsurgical changes. Breast Density Category C. I recommended that she get 3-D mammograms for surveillance.  Return to clinic in 6 months for follow-up and after that we will see her once a year Discussed the differences between different breast density categories.

## 2015-04-15 IMAGING — US US ABDOMEN COMPLETE
1 series · 14 of 25 positions shown · non-contrast
Comparison: None.

CLINICAL DATA: Elevated liver function tests. History of left
breast cancer.

EXAM:
ULTRASOUND ABDOMEN COMPLETE

[Series 1: us abdomen complete · 0.35mm/px · 14 of 84 slices shown]
[im 1/84]
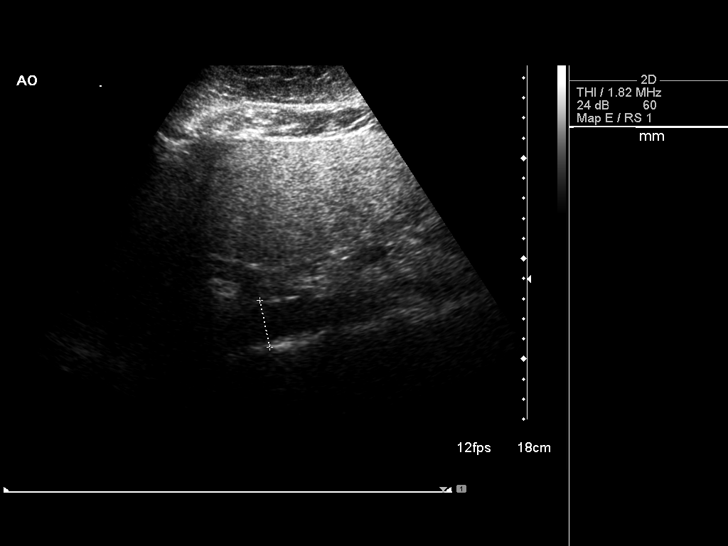
[im 7/84]
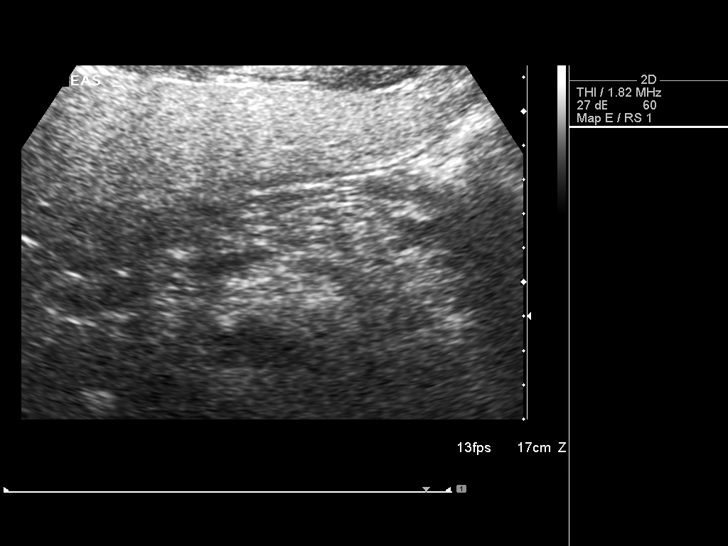
[im 14/84]
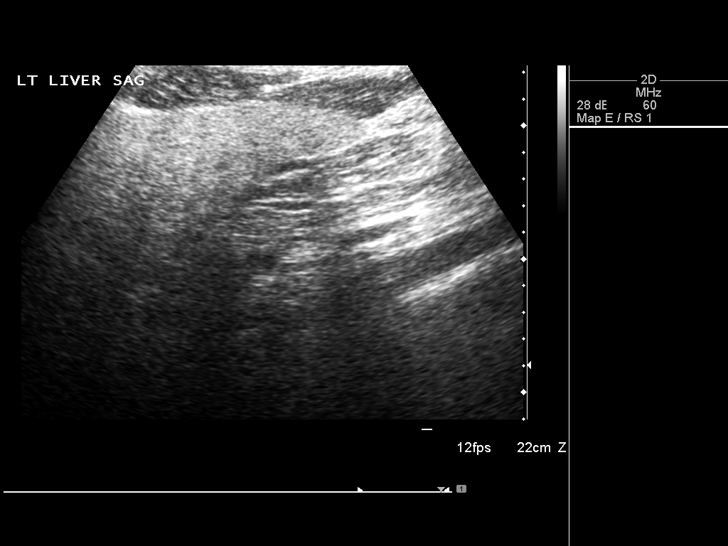
[im 21/84]
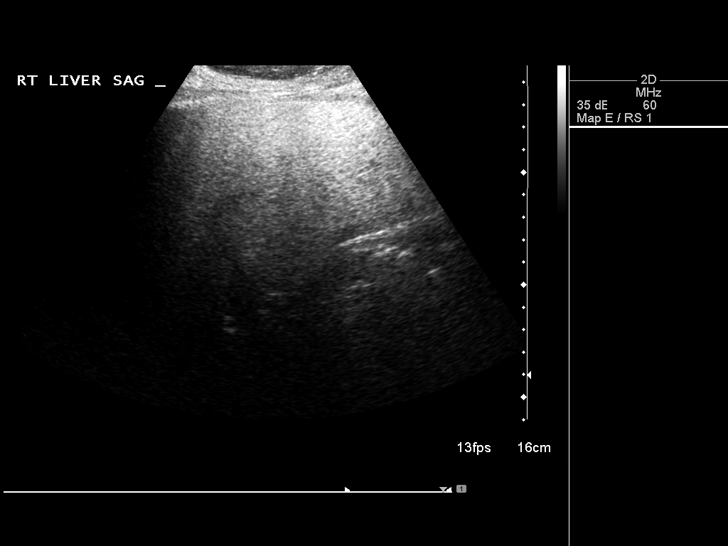
[im 28/84]
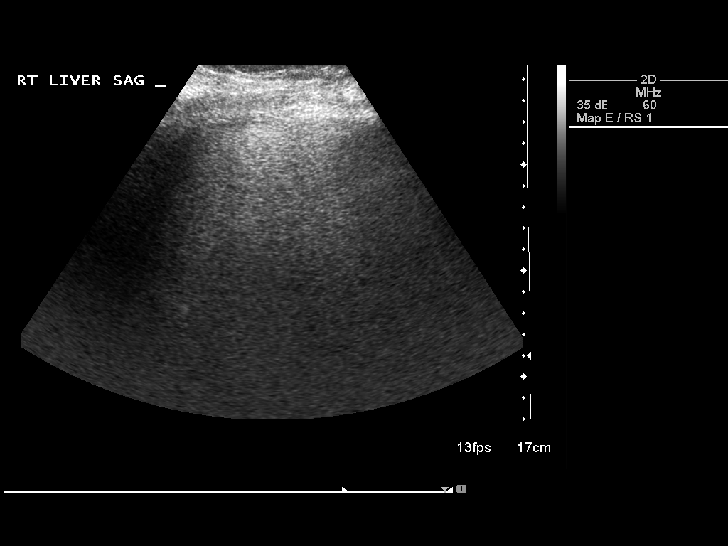
[im 32/84]
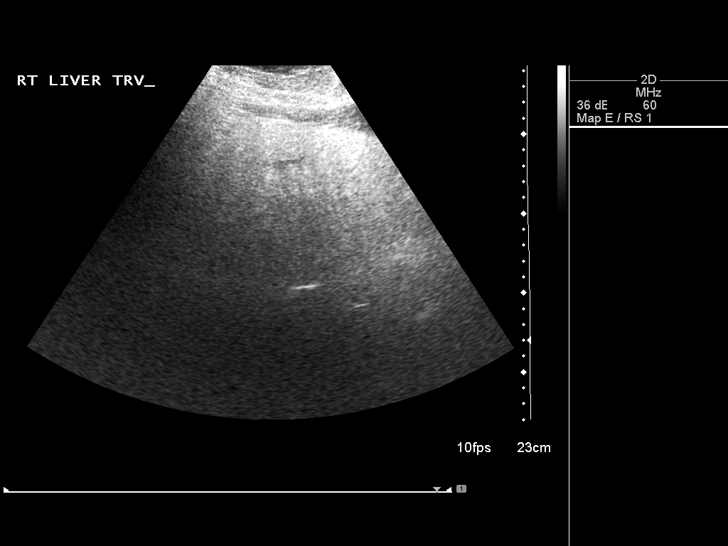
[im 39/84]
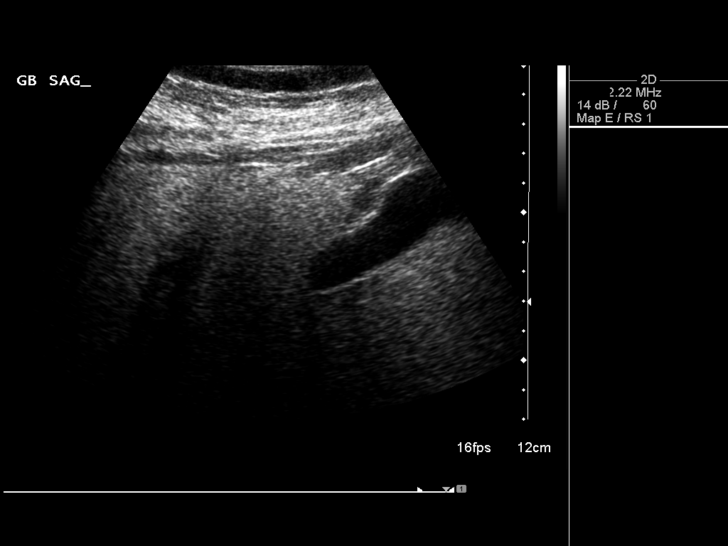
[im 45/84]
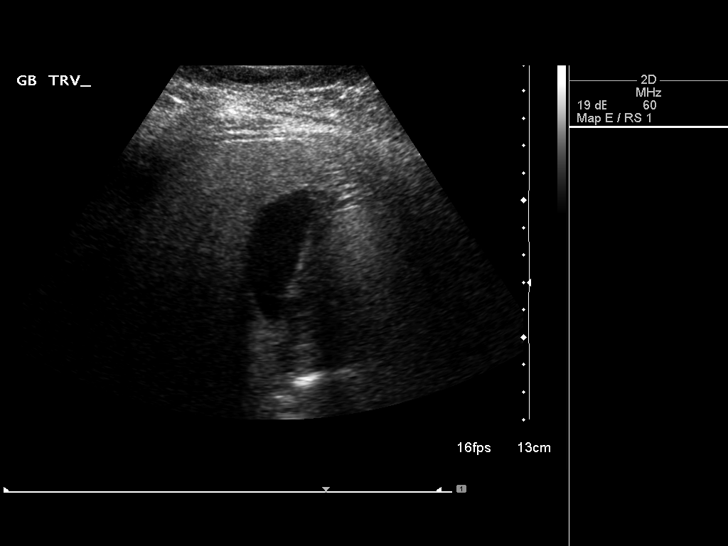
[im 52/84]
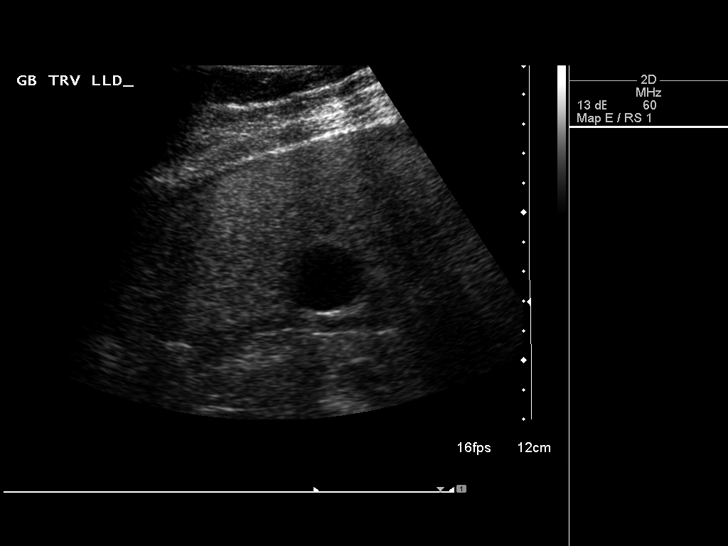
[im 56/84]
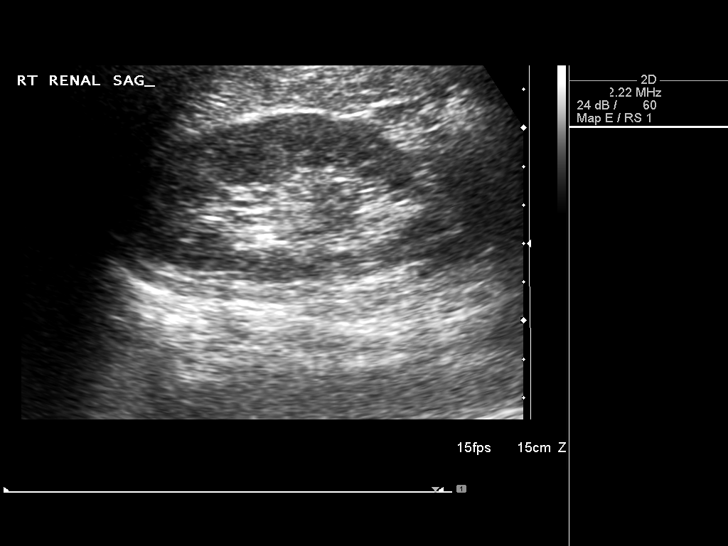
[im 63/84]
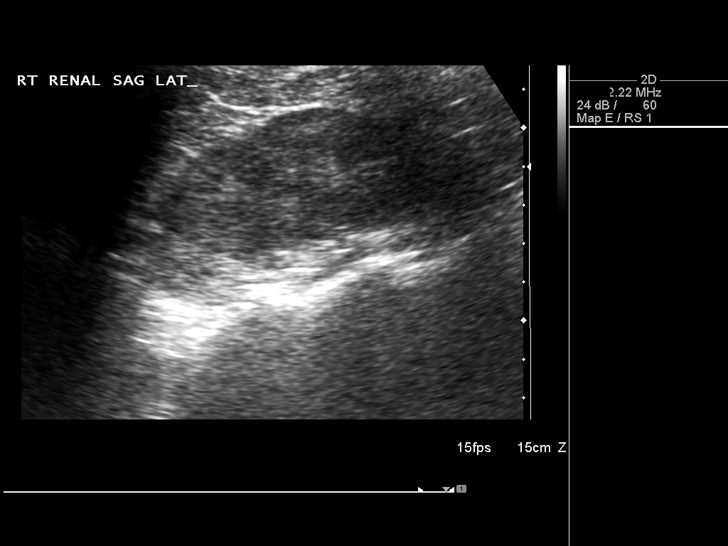
[im 70/84]
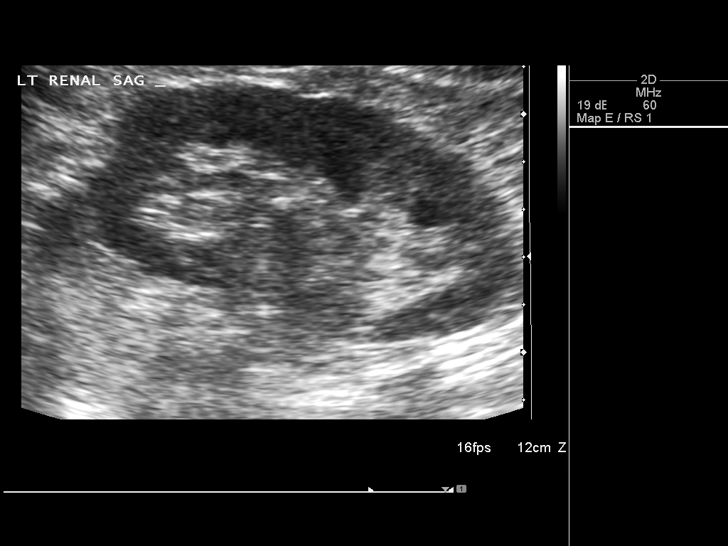
[im 77/84]
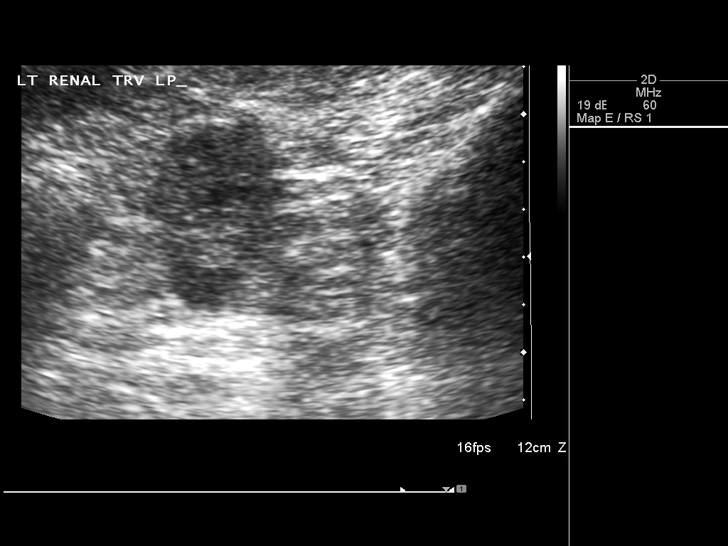
[im 84/84]
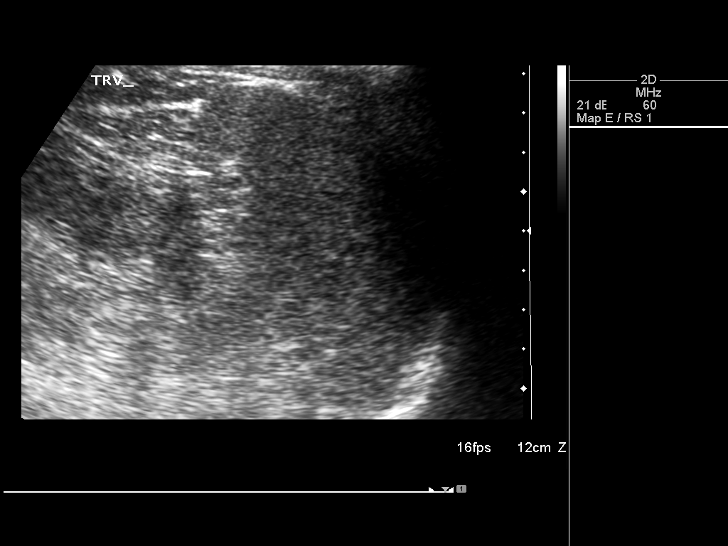

[14 of 25 positions shown; findings below may reference images not displayed]

FINDINGS: Gallbladder:

No gallstones or wall thickening visualized. No sonographic Murphy
sign noted.

Common bile duct:

Diameter: 3 mm

Liver:

No focal lesion identified.  Diffuse hepatic steatosis.

IVC:

Limited visualization due to overlying bowel gas.

Pancreas:

Visualized portion unremarkable.

Spleen:

Size and appearance within normal limits.

Right Kidney:

Length: 10.1 cm. Echogenicity within normal limits. No mass or
hydronephrosis visualized.

Left Kidney:

Length: 9.2 cm. Echogenicity within normal limits. No mass or
hydronephrosis visualized.

Abdominal aorta:

No aneurysm visualized.

Other findings:

None.
IMPRESSION: 1. Diffuse hepatic steatosis.  Otherwise normal exam.
